# Patient Record
Sex: Female | Born: 1970 | Race: White | Hispanic: No | Marital: Married | Smoking: Current every day smoker
Health system: Southern US, Community
[De-identification: ages and names within clinical notes are randomized; demographics above are authoritative.]

## PROBLEM LIST (undated history)

## (undated) DIAGNOSIS — C539 Malignant neoplasm of cervix uteri, unspecified: Secondary | ICD-10-CM

## (undated) DIAGNOSIS — H3530 Unspecified macular degeneration: Secondary | ICD-10-CM

## (undated) DIAGNOSIS — B019 Varicella without complication: Secondary | ICD-10-CM

## (undated) DIAGNOSIS — G43909 Migraine, unspecified, not intractable, without status migrainosus: Secondary | ICD-10-CM

## (undated) DIAGNOSIS — K219 Gastro-esophageal reflux disease without esophagitis: Secondary | ICD-10-CM

## (undated) HISTORY — DX: Varicella without complication: B01.9

## (undated) HISTORY — PX: CHOLECYSTECTOMY: SHX55

## (undated) HISTORY — DX: Gastro-esophageal reflux disease without esophagitis: K21.9

## (undated) HISTORY — DX: Malignant neoplasm of cervix uteri, unspecified: C53.9

## (undated) HISTORY — DX: Migraine, unspecified, not intractable, without status migrainosus: G43.909

## (undated) HISTORY — PX: TYMPANOSTOMY TUBE PLACEMENT: SHX32

## (undated) HISTORY — PX: ABDOMINAL HYSTERECTOMY: SHX81

## (undated) HISTORY — DX: Unspecified macular degeneration: H35.30

---
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
## 1999-07-08 ENCOUNTER — Emergency Department (HOSPITAL_COMMUNITY): Admission: EM | Admit: 1999-07-08 | Discharge: 1999-07-08 | Payer: Self-pay | Admitting: Emergency Medicine

## 1999-07-08 ENCOUNTER — Encounter: Payer: Self-pay | Admitting: Emergency Medicine

## 1999-10-02 ENCOUNTER — Emergency Department (HOSPITAL_COMMUNITY): Admission: EM | Admit: 1999-10-02 | Discharge: 1999-10-02 | Payer: Self-pay | Admitting: Emergency Medicine

## 2000-01-17 ENCOUNTER — Ambulatory Visit (HOSPITAL_COMMUNITY): Admission: RE | Admit: 2000-01-17 | Discharge: 2000-01-17 | Payer: Self-pay | Admitting: General Surgery

## 2000-09-02 ENCOUNTER — Emergency Department (HOSPITAL_COMMUNITY): Admission: EM | Admit: 2000-09-02 | Discharge: 2000-09-02 | Payer: Self-pay | Admitting: Emergency Medicine

## 2000-09-09 ENCOUNTER — Ambulatory Visit (HOSPITAL_COMMUNITY): Admission: RE | Admit: 2000-09-09 | Discharge: 2000-09-09 | Payer: Self-pay | Admitting: *Deleted

## 2003-06-29 ENCOUNTER — Emergency Department (HOSPITAL_COMMUNITY): Admission: EM | Admit: 2003-06-29 | Discharge: 2003-06-29 | Payer: Self-pay | Admitting: Emergency Medicine

## 2003-10-30 ENCOUNTER — Ambulatory Visit (HOSPITAL_COMMUNITY): Admission: RE | Admit: 2003-10-30 | Discharge: 2003-10-30 | Payer: Self-pay | Admitting: General Surgery

## 2003-10-30 ENCOUNTER — Ambulatory Visit (HOSPITAL_BASED_OUTPATIENT_CLINIC_OR_DEPARTMENT_OTHER): Admission: RE | Admit: 2003-10-30 | Discharge: 2003-10-30 | Payer: Self-pay | Admitting: General Surgery

## 2005-07-19 ENCOUNTER — Emergency Department (HOSPITAL_COMMUNITY): Admission: EM | Admit: 2005-07-19 | Discharge: 2005-07-19 | Payer: Self-pay | Admitting: Emergency Medicine

## 2005-12-05 ENCOUNTER — Emergency Department (HOSPITAL_COMMUNITY): Admission: EM | Admit: 2005-12-05 | Discharge: 2005-12-05 | Payer: Self-pay | Admitting: Emergency Medicine

## 2011-11-25 LAB — HM PAP SMEAR: HM Pap smear: NORMAL

## 2012-11-23 ENCOUNTER — Emergency Department (HOSPITAL_COMMUNITY): Payer: Medicaid - Out of State

## 2012-11-23 ENCOUNTER — Encounter (HOSPITAL_COMMUNITY): Payer: Self-pay | Admitting: *Deleted

## 2012-11-23 ENCOUNTER — Emergency Department (HOSPITAL_COMMUNITY)
Admission: EM | Admit: 2012-11-23 | Discharge: 2012-11-23 | Disposition: A | Discharge status: Home / Self Care | Payer: Medicaid - Out of State | Attending: Emergency Medicine | Admitting: Emergency Medicine

## 2012-11-23 DIAGNOSIS — R0789 Other chest pain: Secondary | ICD-10-CM

## 2012-11-23 DIAGNOSIS — F17200 Nicotine dependence, unspecified, uncomplicated: Secondary | ICD-10-CM | POA: Insufficient documentation

## 2012-11-23 DIAGNOSIS — R071 Chest pain on breathing: Secondary | ICD-10-CM | POA: Insufficient documentation

## 2012-11-23 MED ORDER — IBUPROFEN 800 MG PO TABS: 800.0000 mg | ORAL_TABLET | Freq: Three times a day (TID) | ORAL | Status: DC | PRN

## 2012-11-23 MED ORDER — HYDROCODONE-ACETAMINOPHEN 5-325 MG PO TABS
1.0000 | ORAL_TABLET | Freq: Once | ORAL | Status: AC
  Administered 2012-11-23: 1 via ORAL
  Filled 2012-11-23: qty 1

## 2012-11-23 MED ORDER — IBUPROFEN 800 MG PO TABS
800.0000 mg | ORAL_TABLET | Freq: Once | ORAL | Status: AC
  Administered 2012-11-23: 800 mg via ORAL
  Filled 2012-11-23: qty 1

## 2012-11-23 MED ORDER — HYDROCODONE-ACETAMINOPHEN 5-325 MG PO TABS: 1.0000 | ORAL_TABLET | Freq: Four times a day (QID) | ORAL | Status: DC | PRN
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
## 2012-11-24 NOTE — ED Provider Notes (Signed)
Medical screening examination/treatment/procedure(s) were performed by non-physician practitioner and as supervising physician I was immediately available for consultation/collaboration.   Eliazer Hemphill M Marshaun Lortie, MD 11/24/12 0020 

## 2013-07-19 ENCOUNTER — Emergency Department (HOSPITAL_COMMUNITY)
Admission: EM | Admit: 2013-07-19 | Discharge: 2013-07-19 | Disposition: A | Discharge status: Home / Self Care | Payer: Self-pay | Attending: Emergency Medicine | Admitting: Emergency Medicine

## 2013-07-19 ENCOUNTER — Encounter (HOSPITAL_COMMUNITY): Payer: Self-pay

## 2013-07-19 ENCOUNTER — Emergency Department (HOSPITAL_COMMUNITY): Payer: Self-pay

## 2013-07-19 DIAGNOSIS — F17200 Nicotine dependence, unspecified, uncomplicated: Secondary | ICD-10-CM | POA: Insufficient documentation

## 2013-07-19 DIAGNOSIS — Y929 Unspecified place or not applicable: Secondary | ICD-10-CM | POA: Insufficient documentation

## 2013-07-19 DIAGNOSIS — S90212A Contusion of left great toe with damage to nail, initial encounter: Secondary | ICD-10-CM

## 2013-07-19 DIAGNOSIS — W208XXA Other cause of strike by thrown, projected or falling object, initial encounter: Secondary | ICD-10-CM | POA: Insufficient documentation

## 2013-07-19 DIAGNOSIS — Z87828 Personal history of other (healed) physical injury and trauma: Secondary | ICD-10-CM | POA: Insufficient documentation

## 2013-07-19 DIAGNOSIS — Y9389 Activity, other specified: Secondary | ICD-10-CM | POA: Insufficient documentation

## 2013-07-19 DIAGNOSIS — S90129A Contusion of unspecified lesser toe(s) without damage to nail, initial encounter: Secondary | ICD-10-CM | POA: Insufficient documentation

## 2013-07-19 MED ORDER — HYDROCODONE-ACETAMINOPHEN 5-325 MG PO TABS
2.0000 | ORAL_TABLET | Freq: Once | ORAL | Status: AC
  Administered 2013-07-19: 2 via ORAL
  Filled 2013-07-19: qty 2

## 2013-07-19 MED ORDER — HYDROCODONE-ACETAMINOPHEN 5-325 MG PO TABS: ORAL_TABLET | ORAL | Status: DC

## 2013-07-19 NOTE — ED Provider Notes (Signed)
CSN: [id number]     Arrival date & time 07/19/13  1819 History  This chart was scribed for non-physician practitioner Junius Finner, PA-C working with Flint Melter, MD by Valera Castle, ED scribe. This patient was seen in room WTR6/WTR6 and the patient's care was started at 6:25 PM.    Chief Complaint  Patient presents with  . Toe Injury    left side    The history is provided by the patient. No language interpreter was used.   HPI Comments: Mary Miles is a 42 y.o. female who presents to the Emergency Department complaining of sudden onset throbbing left great toe pain due to an injury earlier today when she dropped a heavy crate on her foot. She also fell last week walking down stairs and her toenail started turning black and blue. Pt states bearing weight worsens the pain. Pt denies fever. Pt denies falling on her head and LOC. Pt has been taking Tylenol for the pain. Pt states she is experiencing a little ankle pain. She has no history of gout.    History reviewed. No pertinent past medical history. Past Surgical History  Procedure Laterality Date  . Cholecystectomy    . Abdominal hysterectomy     No family history on file. History  Substance Use Topics  . Smoking status: Current Every Day Smoker -- 1.00 packs/day for 4 years    Types: Cigarettes  . Smokeless tobacco: Not on file  . Alcohol Use: No   OB History   Grav Para Term Preterm Abortions TAB SAB Ect Mult Living                 Review of Systems  Constitutional: Negative for fever.  Musculoskeletal: Positive for joint swelling and arthralgias.  All other systems reviewed and are negative.    Allergies  Review of patient's allergies indicates no known allergies.  Home Medications   Current Outpatient Rx  Name  Route  Sig  Dispense  Refill  . acetaminophen (TYLENOL) 500 MG tablet   Oral   Take 500 mg by mouth every 6 (six) hours as needed for pain.         Marland Kitchen HYDROcodone-acetaminophen  (NORCO/VICODIN) 5-325 MG per tablet      Take 1-2 pills every 4-6 hours as needed for pain.   10 tablet   0    BP 112/75  Pulse 85  Temp(Src) 98.6 F (37 C) (Oral)  Resp 18  Ht 5\' 6"  (1.676 m)  Wt 150 lb (68.04 kg)  BMI 24.22 kg/m2  SpO2 100%  Physical Exam  Nursing note and vitals reviewed. Constitutional: She is oriented to person, place, and time. She appears well-developed and well-nourished.  HENT:  Head: Normocephalic and atraumatic.  Eyes: EOM are normal.  Neck: Normal range of motion. Neck supple.  Cardiovascular: Normal rate, regular rhythm and normal heart sounds.   Pulmonary/Chest: Effort normal and breath sounds normal.  Abdominal: Soft. There is no tenderness.  Musculoskeletal: Normal range of motion.  Tenderness to palpation of left great toe. Dorsal aspect hematoma under the nail bed. 2+ pedal pulse. Full ROM. NO warmth or erythema.   Neurological: She is alert and oriented to person, place, and time.  Skin: Skin is warm and dry.  Psychiatric: She has a normal mood and affect. Her behavior is normal.    ED Course  NERVE BLOCK Date/Time: 07/19/2013 8:29 PM Performed by: Junius Finner Authorized by: Junius Finner Consent: Verbal consent obtained. Risks and  benefits: risks, benefits and alternatives were discussed Consent given by: patient Patient understanding: patient states understanding of the procedure being performed Patient consent: the patient's understanding of the procedure matches consent given Patient identity confirmed: verbally with patient Indications: pain relief Body area: lower extremity Nerve: digital Laterality: left Patient sedated: no Preparation: Patient was prepped and draped in the usual sterile fashion. Patient position: supine Needle gauge: 25 G Location technique: anatomical landmarks Local anesthetic: lidocaine 1% without epinephrine and bupivacaine 0.5% without epinephrine Anesthetic total: 4 ml Outcome: pain  improved Patient tolerance: Patient tolerated the procedure well with no immediate complications. Comments: Procedure was performed on left great toe for pressure relief from subungal hematoma.  Nail was raise off underlying skin but not removed.     DIAGNOSTIC STUDIES: Oxygen Saturation is 100% on room air, normal by my interpretation.    COORDINATION OF CARE: 7:29 PM-Discussed treatment plan which includes consulting with the physician with pt at bedside and pt agreed to plan.   7:36 PM Will I&D left great toenail.  INCISION AND DRAINAGE Performed by: Junius Finner PA-C Consent: Verbal consent obtained. Risks and benefits: risks, benefits and alternatives were discussed Type: hematoma  Body area: left great toe  Anesthesia: local infiltration  Incision was made with a 18 gauge needle.  Local anesthetic: none  Anesthetic total: 0 ml  Complexity: complex Blunt dissection to break up loculations  Drainage: serosanguineous  Drainage amount: none  Packing material: none  Patient tolerance: Patient tolerated the procedure well with no immediate complications.     Labs Review Labs Reviewed - No data to display Imaging Review Dg Foot Complete Left  07/19/2013   *RADIOLOGY REPORT*  Clinical Data: 13 injury.  LEFT FOOT - COMPLETE 3+ VIEW  Comparison: None.  Findings: Bones, joint spaces and soft tissues demonstrate no evidence of fracture or dislocation.  There is minimal degenerative change of the first MTP joint.  IMPRESSION: No acute findings.   Original Report Authenticated By: Elberta Fortis, M.D.    MDM   1. Hematoma, subungual, great toe, left, initial encounter    Pt has subungal hematoma of left great toe. C/o severe pain and pressure. Attempted pressure relief with 18gtt needle.  Pt still c/o pain.  Discussed with Dr. Effie Shy who examined pt.  Digital block was performed and nail raised but not removed.  Bupivicaine used for longer relief.  Advised pt nail may  fall off but does not appear infected at this time.  Encouraged to keep elevated when possible.  Rx: norco. F/u with [facility]. Return precautions provided. Pt verbalized understanding and agreement with tx plan.   I personally performed the services described in this documentation, which was scribed in my presence. The recorded information has been reviewed and is accurate.   Junius Finner, PA-C 07/19/13 2032

## 2013-07-19 NOTE — ED Notes (Signed)
PA O'Malley at bedside. 

## 2013-07-19 NOTE — ED Notes (Signed)
Pt reports fell last week injured left toe and re injured left toe today. Dropped 8 2 Liter crated onto left foot today. Good CMS throbbing pain to foot

## 2013-07-19 NOTE — ED Notes (Signed)
Pt has a ride home.  

## 2013-07-19 NOTE — ED Notes (Signed)
L great toe dressed with Telfa gauze and coban wrap. Pt instructed on further wound care.

## 2013-07-20 NOTE — ED Provider Notes (Signed)
Medical screening examination/treatment/procedure(s) were performed by non-physician practitioner and as supervising physician I was immediately available for consultation/collaboration.  Flint Melter, MD 07/20/13 [phone]

## 2013-09-16 ENCOUNTER — Emergency Department (HOSPITAL_COMMUNITY)
Admission: EM | Admit: 2013-09-16 | Discharge: 2013-09-16 | Disposition: A | Discharge status: Home / Self Care | Payer: No Typology Code available for payment source | Attending: Emergency Medicine | Admitting: Emergency Medicine

## 2013-09-16 ENCOUNTER — Encounter (HOSPITAL_COMMUNITY): Payer: Self-pay | Admitting: Emergency Medicine

## 2013-09-16 DIAGNOSIS — R21 Rash and other nonspecific skin eruption: Secondary | ICD-10-CM

## 2013-09-16 DIAGNOSIS — F17200 Nicotine dependence, unspecified, uncomplicated: Secondary | ICD-10-CM | POA: Insufficient documentation

## 2013-09-16 DIAGNOSIS — N949 Unspecified condition associated with female genital organs and menstrual cycle: Secondary | ICD-10-CM | POA: Insufficient documentation

## 2013-09-16 DIAGNOSIS — N898 Other specified noninflammatory disorders of vagina: Secondary | ICD-10-CM

## 2013-09-16 LAB — URINALYSIS, ROUTINE W REFLEX MICROSCOPIC
Bilirubin Urine: NEGATIVE
Glucose, UA: NEGATIVE mg/dL
Ketones, ur: NEGATIVE mg/dL
Specific Gravity, Urine: 1.029 (ref 1.005–1.030)
pH: 6.5 (ref 5.0–8.0)

## 2013-09-16 LAB — WET PREP, GENITAL: Yeast Wet Prep HPF POC: NONE SEEN

## 2013-09-16 MED ORDER — VALACYCLOVIR HCL 1 G PO TABS: 1000.0000 mg | ORAL_TABLET | Freq: Two times a day (BID) | ORAL | Status: DC

## 2013-09-16 MED ORDER — DEXAMETHASONE SODIUM PHOSPHATE 10 MG/ML IJ SOLN
10.0000 mg | Freq: Once | INTRAMUSCULAR | Status: AC
  Administered 2013-09-16: 10 mg via INTRAVENOUS
  Filled 2013-09-16: qty 1

## 2013-09-16 MED ORDER — LIDOCAINE HCL 2 % EX GEL: CUTANEOUS | Status: DC | PRN

## 2013-09-16 MED ORDER — PERMETHRIN 5 % EX CREA: TOPICAL_CREAM | CUTANEOUS | Status: DC

## 2013-09-16 MED ORDER — DIPHENHYDRAMINE HCL 50 MG/ML IJ SOLN
25.0000 mg | Freq: Once | INTRAMUSCULAR | Status: AC
  Administered 2013-09-16: 25 mg via INTRAVENOUS
  Filled 2013-09-16: qty 1

## 2013-09-16 MED ORDER — FAMOTIDINE IN NACL 20-0.9 MG/50ML-% IV SOLN
20.0000 mg | Freq: Once | INTRAVENOUS | Status: AC
  Administered 2013-09-16: 20 mg via INTRAVENOUS
  Filled 2013-09-16: qty 50

## 2013-09-16 NOTE — ED Notes (Signed)
Pt states she broke out in rash on Monday and then yesterday (Thursday)  Rash moved to vaginal area and it is painful to walk , sit or stand and it is now blisters and bleeding.

## 2013-09-16 NOTE — ED Provider Notes (Signed)
CSN: [id number]     Arrival date & time 09/16/13  2016 History   First MD Initiated Contact with Patient 09/16/13 2030     Chief Complaint  Patient presents with  . Vaginal Pain  . Rash   (Consider location/radiation/quality/duration/timing/severity/associated sxs/prior Treatment) The history is provided by the patient and medical records.   This is a 42 year old female with no significant past medical history presenting to the ED for diffuse bodily rash, onset 4 days ago.  Patient states rash is extremely pruritic and some places are painful where she has scratched her skin raw.  No recent tick exposure.  No fevers, sweats, chills, or body aches.  Patient states she began having vaginal pain and irritation on Wednesday evening. States it is difficult for her to wear clothes, sit down, or walk around due to friction. Earlier tonight she became so uncomfortable she had her boyfriend look at her vaginal area, he states there was a lesion that looked like an ulcer. Patient has no prior history of STDs.  No vaginal discharge or urinary sx.  No recent changes in soaps or detergents.  No recent travel but did move to a new home and bought a new mattress.  Husband has some isolated lesions on his abdomen that are also pruritic.  Has been using topical benadryl cream without relief.  History reviewed. No pertinent past medical history. Past Surgical History  Procedure Laterality Date  . Cholecystectomy    . Abdominal hysterectomy     No family history on file. History  Substance Use Topics  . Smoking status: Current Every Day Smoker -- 1.00 packs/day for 4 years    Types: Cigarettes  . Smokeless tobacco: Not on file  . Alcohol Use: No   OB History   Grav Para Term Preterm Abortions TAB SAB Ect Mult Living                 Review of Systems  Genitourinary: Positive for vaginal pain.  Skin: Positive for rash.    Allergies  Review of patient's allergies indicates no known  allergies.  Home Medications   Current Outpatient Rx  Name  Route  Sig  Dispense  Refill  . acetaminophen (TYLENOL) 500 MG tablet   Oral   Take 500 mg by mouth every 6 (six) hours as needed for pain.         . Aspirin-Acetaminophen-Caffeine (GOODYS EXTRA STRENGTH) [phone] MG PACK   Oral   Take 1 packet by mouth every 8 (eight) hours as needed (pain).          BP 118/80  Pulse 90  Temp(Src) 98.5 F (36.9 C) (Oral)  Resp 16  SpO2 100%  Physical Exam  Nursing note and vitals reviewed. Constitutional: She is oriented to person, place, and time. She appears well-developed and well-nourished.  HENT:  Head: Normocephalic and atraumatic.  Mouth/Throat: Oropharynx is clear and moist.  Eyes: Conjunctivae and EOM are normal. Pupils are equal, round, and reactive to light.  Neck: Normal range of motion.  Cardiovascular: Normal rate, regular rhythm and normal heart sounds.   Pulmonary/Chest: Effort normal and breath sounds normal.  Abdominal: Soft. Bowel sounds are normal. There is no tenderness. There is no guarding.  Genitourinary:    No bleeding around the vagina. No vaginal discharge found.  Musculoskeletal: Normal range of motion. She exhibits no edema.  Neurological: She is alert and oriented to person, place, and time.  Skin: Skin is warm and dry. Rash noted. No  petechiae noted. Rash is not vesicular.  Diffuse, bite appearing lesions scattered across BUE and trunk; extremely pruritic; no lesions on palms or soles  Psychiatric: She has a normal mood and affect.    ED Course  Procedures (including critical care time) Labs Review Labs Reviewed  WET PREP, GENITAL - Abnormal; Notable for the following:    Clue Cells Wet Prep HPF POC RARE (*)    WBC, Wet Prep HPF POC RARE (*)    All other components within normal limits  URINALYSIS, ROUTINE W REFLEX MICROSCOPIC - Abnormal; Notable for the following:    APPearance CLOUDY (*)    Leukocytes, UA SMALL (*)    All other  components within normal limits  URINE MICROSCOPIC-ADD ON - Abnormal; Notable for the following:    Squamous Epithelial / LPF FEW (*)    Bacteria, UA FEW (*)    All other components within normal limits  GC/CHLAMYDIA PROBE AMP  RPR   Imaging Review No results found.  EKG Interpretation   None       MDM   1. Vaginal lesion   2. Rash and other nonspecific skin eruption    Scattered, pruritic, bite appearing lesions scattered across BUE and trunk. No petechia, no vesicles.  Sx controlled with pepcid, decadron, and benadryl.  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  RPR, Gc/Chl pending.  U/a as above.  Wet prep with rare clue cells, however i do not feel this indicates infection.  Will initiate treatment with valtrex, permethrin, and topical lidocaine.  Instructed that she will be notified in 48-72 hours if culture results are abnormal or require treatment.  Discussed plan with pt, he agreed.  Return precautions advised.  Garlon Hatchet, PA-C 09/16/13 2356

## 2013-09-16 NOTE — ED Notes (Signed)
Pt states that she has broke out in a whole body rash that began Monday; pt states that the rash is itchy and red; pt states that she began to have vaginal pain Wed night; pt denies drainage but states that the vaginal area is swollen, red and irritated; pt states that it is uncomfortable to wear pants to walk or sit due to irritation.

## 2013-09-16 NOTE — ED Notes (Signed)
Pt. Made aware for the need of urine. 

## 2013-09-16 NOTE — ED Notes (Signed)
Pt ambulated to bathroom to obtain urine sample.

## 2013-09-17 LAB — URINE MICROSCOPIC-ADD ON

## 2013-09-17 LAB — GC/CHLAMYDIA PROBE AMP: GC Probe RNA: NEGATIVE

## 2013-09-18 NOTE — ED Provider Notes (Signed)
Medical screening examination/treatment/procedure(s) were performed by non-physician practitioner and as supervising physician I was immediately available for consultation/collaboration.  EKG Interpretation   None         Kristen N Ward, DO 09/18/13 0727 

## 2013-11-24 ENCOUNTER — Emergency Department (HOSPITAL_COMMUNITY)
Admission: EM | Admit: 2013-11-24 | Discharge: 2013-11-24 | Disposition: A | Discharge status: Home / Self Care | Payer: No Typology Code available for payment source | Attending: Emergency Medicine | Admitting: Emergency Medicine

## 2013-11-24 ENCOUNTER — Encounter (HOSPITAL_COMMUNITY): Payer: Self-pay | Admitting: Emergency Medicine

## 2013-11-24 DIAGNOSIS — H9209 Otalgia, unspecified ear: Secondary | ICD-10-CM | POA: Insufficient documentation

## 2013-11-24 DIAGNOSIS — F17200 Nicotine dependence, unspecified, uncomplicated: Secondary | ICD-10-CM | POA: Insufficient documentation

## 2013-11-24 DIAGNOSIS — H60399 Other infective otitis externa, unspecified ear: Secondary | ICD-10-CM | POA: Insufficient documentation

## 2013-11-24 DIAGNOSIS — R05 Cough: Secondary | ICD-10-CM | POA: Insufficient documentation

## 2013-11-24 DIAGNOSIS — R110 Nausea: Secondary | ICD-10-CM | POA: Insufficient documentation

## 2013-11-24 DIAGNOSIS — R059 Cough, unspecified: Secondary | ICD-10-CM | POA: Insufficient documentation

## 2013-11-24 DIAGNOSIS — H6092 Unspecified otitis externa, left ear: Secondary | ICD-10-CM

## 2013-11-24 DIAGNOSIS — R42 Dizziness and giddiness: Secondary | ICD-10-CM | POA: Insufficient documentation

## 2013-11-24 MED ORDER — ANTIPYRINE-BENZOCAINE 5.4-1.4 % OT SOLN: 3.0000 [drp] | OTIC | Status: DC | PRN

## 2013-11-24 MED ORDER — OXYCODONE-ACETAMINOPHEN 5-325 MG PO TABS
1.0000 | ORAL_TABLET | Freq: Once | ORAL | Status: AC
  Administered 2013-11-24: 1 via ORAL
  Filled 2013-11-24: qty 1

## 2013-11-24 MED ORDER — HYDROCODONE-ACETAMINOPHEN 5-325 MG PO TABS: 1.0000 | ORAL_TABLET | ORAL | Status: DC | PRN

## 2013-11-24 MED ORDER — NEOMYCIN-POLYMYXIN-HC 3.5-10000-1 OT SUSP: 4.0000 [drp] | Freq: Four times a day (QID) | OTIC | Status: DC

## 2013-11-24 NOTE — ED Notes (Signed)
Pt states she began having left ear pain 2 days ago.  Pt endorses nausea, but denies fever and vomiting.  Pt's left tympanic membrane is red.  She states it feels as though she has fluid in her inner ear. Pt states several years ago she tubes placed in her ears due to fluid in her inner ear.

## 2013-11-24 NOTE — ED Notes (Signed)
Pt states that she had plugs put in ear 2 yrs ago and had them come out and is now having pain to lt ear and feels like fluid is in ear.

## 2013-11-24 NOTE — Discharge Instructions (Signed)
Read the information below.  Use the prescribed medication as directed.  Please discuss all new medications with your pharmacist.  Do not take additional tylenol while taking the prescribed pain medication to avoid overdose.  You may return to the Emergency Department at any time for worsening condition or any new symptoms that concern you.  If you develop fevers, uncontrolled pain, uncontrolled vomiting, bleeding from your ear, or difficulty breathing or swallowing return to the ER for a recheck.      Otitis Externa Otitis externa is a bacterial or fungal infection of the outer ear canal. This is the area from the eardrum to the outside of the ear. Otitis externa is sometimes called "swimmer's ear." CAUSES  Possible causes of infection include:  Swimming in dirty water.  Moisture remaining in the ear after swimming or bathing.  Mild injury (trauma) to the ear.  Objects stuck in the ear (foreign body).  Cuts or scrapes (abrasions) on the outside of the ear. SYMPTOMS  The first symptom of infection is often itching in the ear canal. Later signs and symptoms may include swelling and redness of the ear canal, ear pain, and yellowish-white fluid (pus) coming from the ear. The ear pain may be worse when pulling on the earlobe. DIAGNOSIS  Your caregiver will perform a physical exam. A sample of fluid may be taken from the ear and examined for bacteria or fungi. TREATMENT  Antibiotic ear drops are often given for 10 to 14 days. Treatment may also include pain medicine or corticosteroids to reduce itching and swelling. PREVENTION   Keep your ear dry. Use the corner of a towel to absorb water out of the ear canal after swimming or bathing.  Avoid scratching or putting objects inside your ear. This can damage the ear canal or remove the protective wax that lines the canal. This makes it easier for bacteria and fungi to grow.  Avoid swimming in lakes, polluted water, or poorly chlorinated  pools.  You may use ear drops made of rubbing alcohol and vinegar after swimming. Combine equal parts of white vinegar and alcohol in a bottle. Put 3 or 4 drops into each ear after swimming. HOME CARE INSTRUCTIONS   Apply antibiotic ear drops to the ear canal as prescribed by your caregiver.  Only take over-the-counter or prescription medicines for pain, discomfort, or fever as directed by your caregiver.  If you have diabetes, follow any additional treatment instructions from your caregiver.  Keep all follow-up appointments as directed by your caregiver. SEEK MEDICAL CARE IF:   You have a fever.  Your ear is still red, swollen, painful, or draining pus after 3 days.  Your redness, swelling, or pain gets worse.  You have a severe headache.  You have redness, swelling, pain, or tenderness in the area behind your ear. MAKE SURE YOU:   Understand these instructions.  Will watch your condition.  Will get help right away if you are not doing well or get worse. Document Released: 11/10/2005 Document Revised: 02/02/2012 Document Reviewed: 11/27/2011 [facility] Patient Information 2014 [place].   Emergency Department Resource Guide 1) Find a Doctor and Pay Out of Pocket Although you won't have to find out who is covered by your insurance plan, it is a good idea to ask around and get recommendations. You will then need to call the office and see if the doctor you have chosen will accept you as a new patient and what types of options they offer for patients who  are self-pay. Some doctors offer discounts or will set up payment plans for their patients who do not have insurance, but you will need to ask so you aren't surprised when you get to your appointment.  2) Contact Your Local Health Department Not all health departments have doctors that can see patients for sick visits, but many do, so it is worth a call to see if yours does. If you don't know where your local health  department is, you can check in your phone book. The CDC also has a tool to help you locate your state's health department, and many state websites also have listings of all of their local health departments.  3) Find a [facility] If your illness is not likely to be very severe or complicated, you may want to try a walk in clinic. These are popping up all over the country in pharmacies, drugstores, and shopping centers. They're usually staffed by nurse practitioners or physician assistants that have been trained to treat common illnesses and complaints. They're usually fairly quick and inexpensive. However, if you have serious medical issues or chronic medical problems, these are probably not your best option.  No Primary Care Doctor: - Call Health Connect at  [phone] - they can help you locate a primary care doctor that  accepts your insurance, provides certain services, etc. - Physician Referral Service- [phone]  Chronic Pain Problems: Organization         Address  Phone   Notes  [facility]  [phone] Patients need to be referred by their primary care doctor.   Medication Assistance: Organization         Address  Phone   Notes  [facility] Medication [facility] [address] [place] [address] [place], [place] [address] [phone] --Must be a resident of [facility] -- Must have NO insurance coverage whatsoever (no Medicaid/ Medicare, etc.) -- The pt. MUST have a primary care doctor that directs their care regularly and follows them in the community   MedAssist  [phone]   [employer]  [phone]    Agencies that provide inexpensive medical care: Organization         Address  Phone   Notes  [place]  [phone]   [facility]    [phone]   [facility] [place], [place] [address] [phone]   [place] [address], [place] [phone]   Planned Parenthood    [phone]   [facility]    [phone]   [place] and [place] [address], [place] Phone:  [phone], Fax:  [phone] Hours of Operation:  9 am - 6 pm, M-F.  Also accepts Medicaid/Medicare and self-pay.  [facility] for [place], [address],  Phone: [phone], Fax: [phone]. Hours of Operation:  8:30 am - 5:30 pm, M-F.  Also accepts Medicaid and self-pay.  [facility] [place] [address], [place] Phone: [phone]   [place], [place], [place] [phone], Ext. [phone] Mondays & Thursdays: 7-9 AM.  First 15 patients are seen on a first come, first serve basis.    [place] Providers:  Organization         Address  Phone   Notes  [facility] [address], [place] [phone] Also accepts self-pay patients.  [facility] [id number] [place], [place]  [phone]   [place], [address], [place] [phone]   [facility] [address] [place] [phone]   Lucianne Lei [address], [place]   [phone] Only accepts [place] Access [place] patients after they have their name applied to their card.   Self-Pay (no insurance) in [facility]:  Organization         Address  Phone   Notes  Sickle Cell Patients, [facility] [place] [phone]   [facility] [place] [phone]   [facility] [place]  [place], [place], [place] [phone]   [facility]/Dr. Osei-Bonsu  [address], [place] or [address], [place] [phone] Phone number for both [place] and  [place] locations is the same.  Urgent Medical and [facility] [address] [place] [phone]   [facility] [address], [place] or [address] [phone]   [facility] [address] [place] [phone], phone; [phone], fax Sees patients 1st and 3rd Saturday of every month.  Must not qualify for public or private insurance (i.e. Medicaid, Medicare, [place] Health Choice, Veterans' Benefits)  Household income should be no more than 200% of the poverty level The clinic cannot treat you if you are pregnant or think you are pregnant  Sexually transmitted diseases are not treated at the clinic.    Dental Care: Organization         Address  Phone  Notes  [facility] Department of [facility] [place] [phone] Accepts children up to age 26 who are enrolled in [place] or [place]; pregnant women with a Medicaid card; and children who have applied for Medicaid or [place] Health Choice, but were declined, whose parents can pay a reduced fee at time of service.  [facility] Department of [facility]  [address], [place] [phone] Accepts children up to age 31 who are enrolled in [place] or [place]; pregnant women with a Medicaid card; and children who have applied for Medicaid or [place] Health Choice, but were declined, whose parents can pay a reduced fee at time of service.  [place] Adult Dental Access PROGRAM  [place] [phone] Patients are seen by appointment only. Walk-ins are not accepted. [place] will see patients 18 years of age and older. Monday - Tuesday (8am-5pm) Most Wednesdays (8:30-5pm) $30 per visit, cash only  [facility] Adult Dental Access PROGRAM  [address], [facility] [place] [phone] Patients are seen by appointment only. Walk-ins are not accepted.  [place] will see patients 38 years of age and older. One Wednesday Evening (Monthly: Volunteer Based).  $30 per visit, cash only  [place]  [phone] for adults; Children under age 57, call Graduate Pediatric Dentistry at [phone]. Children aged 58-14, please call [phone] to request a pediatric application.  Dental services are provided in all areas of dental care including fillings, crowns and bridges, complete and partial dentures, implants, gum treatment, root canals,  and extractions. Preventive care is also provided. Treatment is provided to both adults and children. Patients are selected via a lottery and there is often a waiting list.   [facility] [place] [address] [place]  [phone] [email or web address]   [facility] [address] [place], [place] [phone], Ext. [phone] Second and Fourth Thursday of each month, opens at 6:30 AM; Clinic ends at 9 AM.  Patients are seen on a first-come first-served basis, and a limited number are seen during each clinic.   [facility]  [address] Hillard Danker [place], [place] [phone]   Eligibility Requirements You must have lived in [place], [place], or [place] counties for at least the last three months.   You cannot be eligible for state or federal sponsored [employer], including [employer], [place], or [employer].   You generally cannot be eligible for healthcare insurance through your employer.    How to apply: Eligibility screenings are held every Tuesday and Wednesday afternoon from 1:00 pm until 4:00 pm. You do not need an appointment for the interview!  [facility] [address], [place], [place]   [place]  Cash Department  [place]  [phone]    Behavioral Health Resources in the  Community: Intensive Outpatient Programs Organization         Address  Phone  Notes  [place]. [address], [place], [place] [phone]   [facility] Outpatient [address] [place], [place]   ADS: Alcohol [facility] Drug S[facility] [address] [place], [place]   [place] [address]  [place], [place] or [phone]   Substance Abuse Resources Organization         Address  Phone  Notes  Alcohol and Drug Services  [phone]   [place]  [phone]   [facility]   Chinita Pester  [phone]   Residential & Outpatient Substance Abuse Program  [phone]   Psychological Services Organization         Address  Phone  Notes  [facility] [place]  [place]  [phone]   [place] [address] [place] [address], [place] or [phone]    Mobile Crisis Teams Organization         Address  Phone  Notes  [facility], Mobile Crisis Care Unit  [phone]   [facility]  [address] [place], [place]   [place] Levels [address], [place] [phone]    Self-Help/Support Groups Organization         Address  Phone             Notes  [place]. of [place] - variety of support groups  [place] Call for more information  Narcotics Anonymous (NA), [facility] [address], [employer] [place]  2 meetings at this location   Special educational needs teacher         Address  Phone  Notes  [facility] [place],    [place]  [place]  [address] [place] [phone], [place], [place]   [place], [place] [phone] Admissions: 8am-3pm M-F  Incentives Substance [place] [address],    [place], [place] [phone]   [facility] [address] [place], [place], [place]   [facility] [address]  [place], [place]   [facility]ight Programs - Intensive Outpatient [address], [place], [place]   [facility] ([place] [place].,  [place], [place] [phone] or [phone]   [facility]idential Treatment Services (RTS) [address] [place], [place] Accepts Medicaid  [facility] [place] [address],  [place] [phone] Substance Abuse/Addiction Treatment   [facility] Organization         Address  Phone  Notes  [facility]  [phone]   Domenic Schwab, PhD [address] Arlis Porta [place], [place]   [phone] or [phone]   Arnold Line [place], [place] [phone]   [facility] [address], [place], [place] [phone] Insurance/Medicaid/sponsorship through [facility] [address] [place]                                    [place], [place] [phone] [place] [address], [place] [phone]    Dr. Adele Schilder  [phone]   [facility] Dept. 1) [address]  2) [place] Level 3)  [place] [address], [place] [phone]  [phone]   [place] [phone] or [phone] (After Hours)

## 2013-11-24 NOTE — ED Provider Notes (Signed)
CSN: [id number]     Arrival date & time 11/24/13  1341 History  This chart was scribed for non-physician practitioner Clayton Bibles, PA-C working with Nat Christen, MD by Anastasia Pall, ED scribe. This patient was seen in room WTR8/WTR8 and the patient's care was started at 3:13 PM.   Chief Complaint  Patient presents with  . Otalgia    The history is provided by the patient. No language interpreter was used.   HPI Comments: Mary Miles is a 43 y.o. female who presents to the Emergency Department complaining of sudden, moderate, constant, left ear pain, that radiates down her neck, onset 2 days ago after waking up. She reports yesterday experiencing associated dizziness, stating her left ear began to throb. Also feels like there is fluid in her ear and her hearing is muffled. She reports associated sore throat, and mild, intermittent, dry cough. She denies having tried to put anything in hear ear. She reports 2 years ago having tubes placed in bilateral ears. She states tubes got stuck in her ear and they had to take tubes back out. She denies fever, chills, body aches, and any other associated symptoms.She denies having ear doctor.   PCP - No primary provider on file.  History reviewed. No pertinent past medical history. Past Surgical History  Procedure Laterality Date  . Cholecystectomy    . Abdominal hysterectomy     No family history on file. History  Substance Use Topics  . Smoking status: Current Every Day Smoker -- 1.00 packs/day for 4 years    Types: Cigarettes  . Smokeless tobacco: Not on file  . Alcohol Use: No   OB History   Grav Para Term Preterm Abortions TAB SAB Ect Mult Living                 Review of Systems  Constitutional: Negative for fever and chills.  HENT: Positive for ear pain (left).   Respiratory: Positive for cough (dry).   Gastrointestinal: Positive for nausea. Negative for vomiting.  Musculoskeletal: Negative for myalgias.  Neurological: Positive for  dizziness.    Allergies  Review of patient's allergies indicates no known allergies.  Home Medications   Current Outpatient Rx  Name  Route  Sig  Dispense  Refill  . Aspirin-Acetaminophen-Caffeine (GOODYS EXTRA STRENGTH) 500-325-65 MG PACK   Oral   Take 1 packet by mouth every 8 (eight) hours as needed (pain).          BP 106/73  Pulse 89  Temp(Src) 98.6 F (37 C) (Oral)  Resp 14  SpO2 100%  Physical Exam  Nursing note and vitals reviewed. Constitutional: She appears well-developed and well-nourished. No distress.  HENT:  Head: Normocephalic and atraumatic.  Mouth/Throat: Uvula is midline, oropharynx is clear and moist and mucous membranes are normal.  Left ear canal erythematous and edematous. Tenderness to palpation of tragus and traction on canal.  Neck: Neck supple.  Pulmonary/Chest: Effort normal.  Neurological: She is alert.  Skin: She is not diaphoretic.    ED Course  Procedures (including critical care time)  DIAGNOSTIC STUDIES: Oxygen Saturation is 100% on room air, normal by my interpretation.    COORDINATION OF CARE: 3:16 PM-Discussed treatment plan which includes Auralgan, Hydrocodone, and Cortisporin with pt at bedside and pt agreed to plan.   Labs Review Labs Reviewed - No data to display Imaging Review No results found.  EKG Interpretation   None       MDM   1. Left otitis  externa    Afebrile, nontoxic patient with left otitis externa it is acute. Tympanic membrane is normal. Patient discharged home with Auralgan and, Norco, Cortisporin otic drops.  Discussed findings, treatment, and follow up  with patient.  Pt given return precautions.  Pt verbalizes understanding and agrees with plan.        I personally performed the services described in this documentation, which was scribed in my presence. The recorded information has been reviewed and is accurate.    [place], PA-C 11/24/13 1621

## 2013-11-25 NOTE — ED Provider Notes (Signed)
Medical screening examination/treatment/procedure(s) were performed by non-physician practitioner and as supervising physician I was immediately available for consultation/collaboration.  EKG Interpretation   None        Nat Christen, MD 11/25/13 [phone]

## 2014-01-14 ENCOUNTER — Emergency Department (HOSPITAL_COMMUNITY)
Admission: EM | Admit: 2014-01-14 | Discharge: 2014-01-14 | Disposition: A | Discharge status: Home / Self Care | Payer: No Typology Code available for payment source | Attending: Emergency Medicine | Admitting: Emergency Medicine

## 2014-01-14 ENCOUNTER — Encounter (HOSPITAL_COMMUNITY): Payer: Self-pay | Admitting: Emergency Medicine

## 2014-01-14 ENCOUNTER — Emergency Department (HOSPITAL_COMMUNITY): Payer: No Typology Code available for payment source

## 2014-01-14 DIAGNOSIS — B9789 Other viral agents as the cause of diseases classified elsewhere: Secondary | ICD-10-CM

## 2014-01-14 DIAGNOSIS — F17200 Nicotine dependence, unspecified, uncomplicated: Secondary | ICD-10-CM | POA: Insufficient documentation

## 2014-01-14 DIAGNOSIS — R1110 Vomiting, unspecified: Secondary | ICD-10-CM | POA: Insufficient documentation

## 2014-01-14 DIAGNOSIS — H9209 Otalgia, unspecified ear: Secondary | ICD-10-CM | POA: Insufficient documentation

## 2014-01-14 DIAGNOSIS — R197 Diarrhea, unspecified: Secondary | ICD-10-CM | POA: Insufficient documentation

## 2014-01-14 DIAGNOSIS — J069 Acute upper respiratory infection, unspecified: Secondary | ICD-10-CM

## 2014-01-14 MED ORDER — ACETAMINOPHEN-CODEINE 120-12 MG/5ML PO SOLN: 10.0000 mL | ORAL | Status: DC | PRN

## 2014-01-14 MED ORDER — ACETAMINOPHEN-CODEINE #3 300-30 MG PO TABS
1.0000 | ORAL_TABLET | Freq: Once | ORAL | Status: AC
  Administered 2014-01-14: 1 via ORAL
  Filled 2014-01-14: qty 1

## 2014-01-14 MED ORDER — ALBUTEROL SULFATE HFA 108 (90 BASE) MCG/ACT IN AERS
2.0000 | INHALATION_SPRAY | RESPIRATORY_TRACT | Status: DC | PRN
  Administered 2014-01-14: 2 via RESPIRATORY_TRACT
  Filled 2014-01-14: qty 6.7

## 2014-01-14 MED ORDER — PREDNISONE 50 MG PO TABS
50.0000 mg | ORAL_TABLET | Freq: Every day | ORAL | Status: DC
Start: 2014-01-14 — End: 2014-01-24

## 2014-01-14 MED ORDER — GUAIFENESIN ER 1200 MG PO TB12: 1.0000 | ORAL_TABLET | Freq: Two times a day (BID) | ORAL | Status: DC

## 2014-01-14 NOTE — Discharge Instructions (Signed)
Return here as needed.  Followup with your Dr. for recheck.  Increase your fluid intake, rest as much possible

## 2014-01-14 NOTE — ED Provider Notes (Signed)
Medical screening examination/treatment/procedure(s) were performed by non-physician practitioner and as supervising physician I was immediately available for consultation/collaboration.  EKG Interpretation   None       Rolland Porter, MD, Abram Sander   Janice Norrie, MD 01/14/14 [phone]

## 2014-01-14 NOTE — ED Notes (Signed)
Patient presents today with a chief complaint of left ear pain that began Wednesday with worsening pain today. Patient also reports sore throat with postnasal drip and a fever on Thursday.

## 2014-01-14 NOTE — ED Provider Notes (Signed)
CSN: [id number]     Arrival date & time 01/14/14  1315 History  This chart was scribed for non-physician practitioner, Irena Cords, PA-C,working with Janice Norrie, MD, by Marlowe Kays, ED Scribe.  This patient was seen in room WTR7/WTR7 and the patient's care was started at 1:42 PM.  Chief Complaint  Patient presents with  . Otalgia  . Sore Throat   The history is provided by the patient. No language interpreter was used.   HPI Comments:  Mary Miles is a 43 y.o. female who presents to the Emergency Department complaining of left ear pan that started four days ago. Pt reports associated cough, postnasal drip, and fever (Tmax 101 degrees). She reports post-tussive vomiting intermittently since onset of symptoms with associated diarrhea. Pt states she has taken Tylenol, Mucinex, and Ibuprofen with only mild relief. She states she has tubes in her ears. Pt states she smokes approximately 0.5 PPD.    History reviewed. No pertinent past medical history. Past Surgical History  Procedure Laterality Date  . Cholecystectomy    . Abdominal hysterectomy     No family history on file. History  Substance Use Topics  . Smoking status: Current Every Day Smoker -- 1.00 packs/day for 4 years    Types: Cigarettes  . Smokeless tobacco: Not on file  . Alcohol Use: No   OB History   Grav Para Term Preterm Abortions TAB SAB Ect Mult Living                 Review of Systems A complete 10 system review of systems was obtained and all systems are negative except as noted in the HPI and PMH.   Allergies  Review of patient's allergies indicates no known allergies.  Home Medications   Current Outpatient Rx  Name  Route  Sig  Dispense  Refill  . antipyrine-benzocaine (AURALGAN) otic solution   Right Ear   Place 3 drops into the right ear every 2 (two) hours as needed for ear pain.   10 mL   0   . Aspirin-Acetaminophen-Caffeine (GOODYS EXTRA STRENGTH) 500-325-65 MG PACK   Oral   Take 1  packet by mouth every 8 (eight) hours as needed (pain).         Marland Kitchen HYDROcodone-acetaminophen (NORCO/VICODIN) 5-325 MG per tablet   Oral   Take 1 tablet by mouth every 4 (four) hours as needed.   15 tablet   0   . neomycin-polymyxin-hydrocortisone (CORTISPORIN) 3.5-10000-1 otic suspension   Left Ear   Place 4 drops into the left ear 4 (four) times daily. X 7 days   10 mL   0    Triage Vitals: BP 106/88  Pulse 97  Temp(Src) 98 F (36.7 C) (Oral)  Resp 20  SpO2 98% Physical Exam  Nursing note and vitals reviewed. Constitutional: She is oriented to person, place, and time. She appears well-developed and well-nourished.  HENT:  Head: Normocephalic and atraumatic.  Right Ear: Hearing, tympanic membrane, external ear and ear canal normal.  Left Ear: Hearing and ear canal normal. There is tenderness. No drainage or swelling. A middle ear effusion is present.  Eyes: EOM are normal.  Neck: Normal range of motion.  Cardiovascular: Normal rate, regular rhythm and normal heart sounds.  Exam reveals no gallop and no friction rub.   No murmur heard. Pulmonary/Chest: Effort normal and breath sounds normal. No respiratory distress. She has no wheezes. She has no rales.  Musculoskeletal: Normal range of motion.  Neurological:  She is alert and oriented to person, place, and time.  Skin: Skin is warm and dry.  Psychiatric: She has a normal mood and affect. Her behavior is normal.    ED Course  Procedures (including critical care time) DIAGNOSTIC STUDIES: Oxygen Saturation is 98% on RA, normal by my interpretation.   COORDINATION OF CARE: 1:47 PM- Will order CXR. Pt verbalizes understanding and agrees to plan.  Medications - No data to display  Labs Review Labs Reviewed - No data to display Imaging Review Dg Chest 2 View  01/14/2014   CLINICAL DATA:  Short of breath.  Cough  EXAM: CHEST  2 VIEW  COMPARISON:  11/23/2012  FINDINGS: Normal heart size. Clear lungs. Bronchitic changes.  Mild hyperaeration.  IMPRESSION: No active cardiopulmonary disease.   Electronically Signed   By: Maryclare Bean M.D.   On: 01/14/2014 14:52   Patient be treated for viral URI.  Told to return here as needed.  She is advised to increase her fluid intake, rest as much possible  I personally performed the services described in this documentation, which was scribed in my presence. The recorded information has been reviewed and is accurate.    [place]rent [place], PA-C 01/14/14 [phone]

## 2014-01-24 ENCOUNTER — Encounter: Payer: Self-pay | Admitting: Internal Medicine

## 2014-01-24 ENCOUNTER — Telehealth: Payer: Self-pay | Admitting: Internal Medicine

## 2014-01-24 ENCOUNTER — Ambulatory Visit (INDEPENDENT_AMBULATORY_CARE_PROVIDER_SITE_OTHER): Payer: BC Managed Care – PPO | Admitting: Internal Medicine

## 2014-01-24 VITALS — BP 120/78 | HR 96 | Temp 98.7°F | Ht 65.0 in | Wt 193.2 lb

## 2014-01-24 DIAGNOSIS — Z0000 Encounter for general adult medical examination without abnormal findings: Secondary | ICD-10-CM

## 2014-01-24 DIAGNOSIS — M25559 Pain in unspecified hip: Secondary | ICD-10-CM

## 2014-01-24 DIAGNOSIS — G8929 Other chronic pain: Secondary | ICD-10-CM

## 2014-01-24 MED ORDER — HYDROCODONE-ACETAMINOPHEN 5-325 MG PO TABS: 1.0000 | ORAL_TABLET | Freq: Every evening | ORAL | Status: DC | PRN

## 2014-01-24 MED ORDER — MELOXICAM 7.5 MG PO TABS: 7.5000 mg | ORAL_TABLET | Freq: Every day | ORAL | Status: DC

## 2014-01-24 MED ORDER — TIZANIDINE HCL 4 MG PO CAPS: 4.0000 mg | ORAL_CAPSULE | Freq: Every evening | ORAL | Status: DC | PRN

## 2014-01-24 NOTE — Progress Notes (Signed)
Pre visit review using our clinic review tool, if applicable. No additional management support is needed unless otherwise documented below in the visit note. 

## 2014-01-24 NOTE — Progress Notes (Signed)
HPI: Pt presents today to establish care. Pt moved her from [place], [place]. Pt does has chronic pain issues due to fall and orthopedic concerns. She was going to a pain clinic in [place], [place]. She has concerns today regarding her pain in her hips. She has been unable to obtain any of her prescriptive medications. Other concerns is in regards to her ears. She has Eustachian tubes placed in 2013 for increased ear pain and fluid, however since her surgery the tubes have collapsed and causing increased pain. She was seen and treated in the ER 2/21 for an otitis infection. She does endorses diet and exercise. She is very mobile with her job and states she feel she eats a balanced diet. She denies chest pain or shortness of breath.   LMP: Hysterectomy 2013 Pap smear: 2013 Mammogram: 2013 Tetanus: 2008 Flu shot: never Eye exam: 2013 Dentist: 2013  Past Medical History  Diagnosis Date  . Cervical cancer 1992  . Chicken pox   . Migraines   . GERD (gastroesophageal reflux disease)   . Macular degeneration   :.    No Known Allergies  No family history on file.  History   Social History  . Marital Status: Divorced    Spouse Name: N/A    Number of Children: N/A  . Years of Education: N/A   Occupational History  . Not on file.   Social History Main Topics  . Smoking status: Current Every Day Smoker -- 1.00 packs/day for 4 years    Types: Cigarettes  . Smokeless tobacco: Not on file  . Alcohol Use: No  . Drug Use:   . Sexual Activity:    Other Topics Concern  . Not on file   Social History Narrative  . No narrative on file    ROS:  Constitutional: Denies fever, malaise, fatigue, headache or abrupt weight changes.  HEENT:Endorses ear pain L>R. Denies eye pain, eye redness,  ringing in the ears, wax buildup, runny nose, nasal congestion, bloody nose, or sore throat. Respiratory: Denies difficulty breathing, shortness of breath, cough or sputum production.   Cardiovascular: Denies  chest pain, chest tightness, palpitations or swelling in the hands or feet.  Gastrointestinal: Denies abdominal pain, bloating, constipation, diarrhea or blood in the stool.  GU: Denies frequency, urgency, pain with urination, blood in urine, odor or discharge. Musculoskeletal: Denies decrease in range of motion, difficulty with gait, muscle pain or joint pain and swelling.  Skin: Denies redness, rashes, lesions or ulcercations.  Neurological: Denies dizziness, difficulty with memory, difficulty with speech or problems with balance and coordination.   No other specific complaints in a complete review of systems (except as listed in HPI above).  PE:  There were no vitals taken for this visit. Wt Readings from Last 3 Encounters:  07/19/13 150 lb (68.04 kg)    General: Appears their stated age, overweight, well developed, well nourished in NAD. HEENT: Head: normal shape and size; Eyes: sclera white, no icterus, conjunctiva pink, PERRLA and EOMs intact; Ears:Right ear: Tm's gray and intact, normal light reflex; Left ear: clear serous fluid present near TM; TMs gray and intact, normal light reflex. Nose: mucosa pink and moist, septum midline; Throat/Mouth: Teeth present, mucosa pink and moist, no lesions or ulcerations noted.  Neck: Normal range of motion. Neck supple, trachea midline. No massses, lumps or thyromegaly present.  Cardiovascular: Normal rate and rhythm. S1,S2 noted.  No murmur, rubs or gallops noted. No JVD or BLE edema. No carotid bruits noted. Pulmonary/Chest:  Normal effort and positive vesicular breath sounds. No respiratory distress. No wheezes, rales or ronchi noted.  Abdomen: Soft and nontender. Normal bowel sounds, no bruits noted. No distention or masses noted. Liver, spleen and kidneys non palpable. Musculoskeletal: Normal range of motion. No signs of joint swelling. No difficulty with gait.  Neurological: Alert and oriented. Cranial nerves II-XII intact. Coordination normal.  +DTRs bilaterally. Psychiatric: Mood and affect normal. Behavior is normal. Judgment and thought content normal.   EKG:  BMET No results found for this basename: na, k, cl, co2, glucose, bun, creatinine, calcium, gfrnonaa, gfraa    Lipid Panel  No results found for this basename: chol, trig, hdl, cholhdl, vldl, ldlcalc    CBC No results found for this basename: wbc, rbc, hgb, hct, plt, mcv, mch, mchc, rdw, neutrabs, lymphsabs, monoabs, eosabs, basosabs    Hgb A1C No results found for this basename: HGBA1C     Assessment and Plan: Left ear otalgia: Referred to ENT   Chronic pain Referred to pain clinic  Health maintenance: Stop smoking Continue diet and exercise Refer to ophthalmology Refer for mammogram Will obtain labs for CMET, CBC, lipid panel Follow up in one month for pap smear  Viraj Liby, Demetrius Charity

## 2014-01-24 NOTE — Patient Instructions (Addendum)

## 2014-01-24 NOTE — Telephone Encounter (Signed)
Relevant patient education assigned to patient using Emmi. ° °

## 2014-01-24 NOTE — Progress Notes (Signed)
Subjective:    Patient ID: Mary Miles, female    DOB: Feb 28, 1971, 43 y.o.   MRN: [id number]  HPI  Pt presents to the clinic today establish care. She is transferring care from Dr. Glendale Chard in [place]. Her last CPE was 2012. She does reports that she needs referral to ENT, pain management and her mammogram. She does request some pain medication until she visits pain management. She was on Mobic, Zanaflex and percocet.  LMP: Hysterectomy 2013 Pap smear: 2013 Mammogram: 2013 Tetanus: 2008 Flu shot: never Eye exam: 2013 Dentist: 2013  Review of Systems      Past Medical History  Diagnosis Date  . Cervical cancer 1992  . Chicken pox   . Migraines   . GERD (gastroesophageal reflux disease)     Current Outpatient Prescriptions  Medication Sig Dispense Refill  . acetaminophen (TYLENOL) 500 MG tablet Take 1,000 mg by mouth every 6 (six) hours as needed.      Marland Kitchen ibuprofen (ADVIL,MOTRIN) 200 MG tablet Take 400 mg by mouth every 6 (six) hours as needed for moderate pain.       No current facility-administered medications for this visit.    No Known Allergies  Family History  Problem Relation Age of Onset  . Arthritis Mother   . Hyperlipidemia Mother   . Heart disease Mother   . Hypertension Mother   . Mental illness Mother   . Hyperlipidemia Father   . Heart disease Father   . Stroke Father   . Hypertension Father   . Mental illness Father   . Diabetes Father   . Alcohol abuse Maternal Grandmother     History   Social History  . Marital Status: Divorced    Spouse Name: N/A    Number of Children: N/A  . Years of Education: N/A   Occupational History  . Not on file.   Social History Main Topics  . Smoking status: Current Every Day Smoker -- 0.50 packs/day for 4 years    Types: Cigarettes  . Smokeless tobacco: Not on file  . Alcohol Use: No  . Drug Use: No  . Sexual Activity: Not on file   Other Topics Concern  . Not on file   Social History  Narrative  . No narrative on file     Constitutional: Denies fever, malaise, fatigue, headache or abrupt weight changes.  HEENT: Pt reports bilateral ear pain. Denies eye pain, eye redness, ringing in the ears, wax buildup, runny nose, nasal congestion, bloody nose, or sore throat. Respiratory: Denies difficulty breathing, shortness of breath, cough or sputum production.   Cardiovascular: Denies chest pain, chest tightness, palpitations or swelling in the hands or feet.  Gastrointestinal: Denies abdominal pain, bloating, constipation, diarrhea or blood in the stool.  GU: Denies urgency, frequency, pain with urination, burning sensation, blood in urine, odor or discharge. Musculoskeletal: Pt reports bilateral hip pain. Denies decrease in range of motion, difficulty with gait, muscle pain or joint pain and swelling.  Skin: Denies redness, rashes, lesions or ulcercations.  Neurological: Denies dizziness, difficulty with memory, difficulty with speech or problems with balance and coordination.   No other specific complaints in a complete review of systems (except as listed in HPI above).  Objective:   Physical Exam   BP 120/78  Pulse 96  Temp(Src) 98.7 F (37.1 C) (Oral)  Ht 5\' 5"  (1.651 m)  Wt 193 lb 4 oz (87.658 kg)  BMI 32.16 kg/m2  SpO2 99% Wt Readings from  Last 3 Encounters:  01/24/14 193 lb 4 oz (87.658 kg)  07/19/13 150 lb (68.04 kg)    General: Appears her stated age, overweight but well developed, well nourished in NAD. Skin: Warm, dry and intact. No rashes, lesions or ulcerations noted. HEENT: Head: normal shape and size; Eyes: sclera white, no icterus, conjunctiva pink, PERRLA and EOMs intact; Ears: Tm's gray and intact, normal light reflex, + effusions bilaterally; Nose: mucosa pink and moist, septum midline; Throat/Mouth: Teeth present, mucosa pink and moist, no exudate, lesions or ulcerations noted.  Neck: Normal range of motion. Neck supple, trachea midline. No  massses, lumps or thyromegaly present.  Cardiovascular: Normal rate and rhythm. S1,S2 noted.  No murmur, rubs or gallops noted. No JVD or BLE edema. No carotid bruits noted. Pulmonary/Chest: Normal effort and positive vesicular breath sounds. No respiratory distress. No wheezes, rales or ronchi noted.  Abdomen: Soft and nontender. Normal bowel sounds, no bruits noted. No distention or masses noted. Liver, spleen and kidneys non palpable. Musculoskeletal: Normal range of motion. No signs of joint swelling. No difficulty with gait.  Neurological: Alert and oriented. Cranial nerves II-XII intact. Coordination normal. +DTRs bilaterally. Psychiatric: Mood and affect normal. Behavior is normal. Judgment and thought content normal.         Assessment & Plan:  Left ear otalgia:  Referred to ENT   Chronic pain  Referred to pain clinic Low dose vicodin, zanaflex and mobic ordered  Health maintenance:  Stop smoking Continue diet and exercise Refer to ophthalmology Refer for mammogram Will obtain labs for CMET, CBC, lipid panel  Follow up in one month for pap smear

## 2014-01-25 LAB — CBC
HCT: 41.6 % (ref 36.0–46.0)
Hemoglobin: 14.1 g/dL (ref 12.0–15.0)
MCHC: 33.8 g/dL (ref 30.0–36.0)
MCV: 94.4 fl (ref 78.0–100.0)
PLATELETS: 261 10*3/uL (ref 150.0–400.0)
RBC: 4.41 Mil/uL (ref 3.87–5.11)
RDW: 13.5 % (ref 11.5–14.6)
WBC: 9.6 10*3/uL (ref 4.5–10.5)

## 2014-01-25 LAB — COMPREHENSIVE METABOLIC PANEL
ALBUMIN: 4.3 g/dL (ref 3.5–5.2)
ALT: 25 U/L (ref 0–35)
AST: 20 U/L (ref 0–37)
Alkaline Phosphatase: 51 U/L (ref 39–117)
BUN: 19 mg/dL (ref 6–23)
CALCIUM: 9.4 mg/dL (ref 8.4–10.5)
CO2: 28 meq/L (ref 19–32)
Chloride: 104 mEq/L (ref 96–112)
Creatinine, Ser: 0.8 mg/dL (ref 0.4–1.2)
GFR: 86.04 mL/min (ref >60.00)
GLUCOSE: 76 mg/dL (ref 70–99)
POTASSIUM: 4.9 meq/L (ref 3.5–5.1)
SODIUM: 139 meq/L (ref 135–145)
TOTAL PROTEIN: 7.5 g/dL (ref 6.0–8.3)
Total Bilirubin: 0.7 mg/dL (ref 0.3–1.2)

## 2014-01-25 LAB — LIPID PANEL
Cholesterol: 136 mg/dL (ref 0–200)
HDL: 44.5 mg/dL (ref >39.00)
LDL Cholesterol: 74 mg/dL (ref 0–99)
Total CHOL/HDL Ratio: 3
Triglycerides: 87 mg/dL (ref 0.0–149.0)
VLDL: 17.4 mg/dL (ref 0.0–40.0)

## 2014-01-25 LAB — HEMOGLOBIN A1C: HEMOGLOBIN A1C: 4.7 % (ref 4.6–6.5)

## 2014-01-25 LAB — TSH: TSH: 3.24 u[IU]/mL (ref 0.35–5.50)

## 2014-02-17 ENCOUNTER — Ambulatory Visit: Payer: BC Managed Care – PPO

## 2014-02-24 ENCOUNTER — Inpatient Hospital Stay: Admission: RE | Admit: 2014-02-24 | Payer: BC Managed Care – PPO | Source: Ambulatory Visit

## 2014-03-17 ENCOUNTER — Other Ambulatory Visit: Payer: Self-pay

## 2014-03-17 DIAGNOSIS — G8929 Other chronic pain: Secondary | ICD-10-CM

## 2014-03-17 DIAGNOSIS — M25559 Pain in unspecified hip: Secondary | ICD-10-CM

## 2014-03-17 DIAGNOSIS — Z0000 Encounter for general adult medical examination without abnormal findings: Secondary | ICD-10-CM

## 2014-03-17 MED ORDER — HYDROCODONE-ACETAMINOPHEN 5-325 MG PO TABS: 1.0000 | ORAL_TABLET | Freq: Every evening | ORAL | Status: DC | PRN

## 2014-03-17 NOTE — Telephone Encounter (Signed)
Pt has appt with [facility] on 03/30/14 and pt understands that pain mgt will not write pain meds on first visit and advised pt to get pain med from PCP until established with pain mgt. Pt request rx hydrocodone apap. Call when ready for pick up.

## 2014-03-17 NOTE — Telephone Encounter (Signed)
Rx left in front office for pick up and pt is aware  

## 2014-04-28 ENCOUNTER — Other Ambulatory Visit: Payer: Self-pay

## 2014-04-28 DIAGNOSIS — Z0000 Encounter for general adult medical examination without abnormal findings: Secondary | ICD-10-CM

## 2014-04-28 DIAGNOSIS — M25559 Pain in unspecified hip: Secondary | ICD-10-CM

## 2014-04-28 DIAGNOSIS — G8929 Other chronic pain: Secondary | ICD-10-CM

## 2014-04-28 NOTE — Telephone Encounter (Signed)
Pt left another v/m requesting cb to verify message was received. Left v.m for pt to cb.

## 2014-04-28 NOTE — Telephone Encounter (Signed)
Pt left v/m; pt is trying to get appt with pain mgt; Marlynn Perking is trying to schedule pt with [facility]; pt request rx hydrocodone apap.Please advise.

## 2014-04-28 NOTE — Telephone Encounter (Signed)
Pt called back and pt said she requested the refill of hydrocodone 2 weeks ago;i explained to pt I am not saying she did not call and request med but I did not get message and if she calls again and does not get a prompt reply to call me back and please do not wait 2 weeks when she is out of med. Pt said she would wait until Mon.

## 2014-05-01 MED ORDER — HYDROCODONE-ACETAMINOPHEN 5-325 MG PO TABS: 1.0000 | ORAL_TABLET | Freq: Every evening | ORAL | Status: DC | PRN

## 2014-05-01 NOTE — Telephone Encounter (Signed)
Rx left in front office for pick up and left detailed msg per HIPAA on VM letting pt know Rx is ready for pick up

## 2014-05-29 ENCOUNTER — Other Ambulatory Visit: Payer: Self-pay

## 2014-05-29 DIAGNOSIS — Z0000 Encounter for general adult medical examination without abnormal findings: Secondary | ICD-10-CM

## 2014-05-29 MED ORDER — HYDROCODONE-ACETAMINOPHEN 5-325 MG PO TABS: 1.0000 | ORAL_TABLET | Freq: Every evening | ORAL | Status: DC | PRN

## 2014-05-29 NOTE — Telephone Encounter (Signed)
Pt left v/m requesting rx hydrocodone apap. Call when ready for pick up. Pt still waiting on appt with [facility].Pt has 1 pill left.

## 2014-05-29 NOTE — Telephone Encounter (Signed)
Spoke with patient and advised rx ready for pick-up and it will be at the front desk.  

## 2014-06-28 ENCOUNTER — Other Ambulatory Visit: Payer: Self-pay

## 2014-06-28 DIAGNOSIS — Z0000 Encounter for general adult medical examination without abnormal findings: Secondary | ICD-10-CM

## 2014-06-28 NOTE — Telephone Encounter (Signed)
Pt left v/m requesting rx hydrocodone apap. Call when ready for pick up. Pt still waiting on pain mgt appt.

## 2014-06-29 MED ORDER — HYDROCODONE-ACETAMINOPHEN 5-325 MG PO TABS: 1.0000 | ORAL_TABLET | Freq: Every evening | ORAL | Status: DC | PRN

## 2014-06-29 NOTE — Telephone Encounter (Signed)
Message left notifying patient. Rx placed up front for pick up. 

## 2014-07-12 ENCOUNTER — Encounter (HOSPITAL_COMMUNITY): Payer: Self-pay | Admitting: Emergency Medicine

## 2014-07-12 ENCOUNTER — Emergency Department (HOSPITAL_COMMUNITY)
Admission: EM | Admit: 2014-07-12 | Discharge: 2014-07-12 | Disposition: A | Discharge status: Home / Self Care | Payer: BC Managed Care – PPO | Attending: Emergency Medicine | Admitting: Emergency Medicine

## 2014-07-12 DIAGNOSIS — R110 Nausea: Secondary | ICD-10-CM | POA: Insufficient documentation

## 2014-07-12 DIAGNOSIS — Z90710 Acquired absence of both cervix and uterus: Secondary | ICD-10-CM | POA: Insufficient documentation

## 2014-07-12 DIAGNOSIS — Z8669 Personal history of other diseases of the nervous system and sense organs: Secondary | ICD-10-CM | POA: Diagnosis not present

## 2014-07-12 DIAGNOSIS — F17200 Nicotine dependence, unspecified, uncomplicated: Secondary | ICD-10-CM | POA: Insufficient documentation

## 2014-07-12 DIAGNOSIS — Z791 Long term (current) use of non-steroidal anti-inflammatories (NSAID): Secondary | ICD-10-CM | POA: Diagnosis not present

## 2014-07-12 DIAGNOSIS — Z8719 Personal history of other diseases of the digestive system: Secondary | ICD-10-CM | POA: Diagnosis not present

## 2014-07-12 DIAGNOSIS — K089 Disorder of teeth and supporting structures, unspecified: Secondary | ICD-10-CM | POA: Diagnosis not present

## 2014-07-12 DIAGNOSIS — Z8619 Personal history of other infectious and parasitic diseases: Secondary | ICD-10-CM | POA: Diagnosis not present

## 2014-07-12 DIAGNOSIS — Z8541 Personal history of malignant neoplasm of cervix uteri: Secondary | ICD-10-CM | POA: Diagnosis not present

## 2014-07-12 DIAGNOSIS — J029 Acute pharyngitis, unspecified: Secondary | ICD-10-CM | POA: Insufficient documentation

## 2014-07-12 DIAGNOSIS — K0889 Other specified disorders of teeth and supporting structures: Secondary | ICD-10-CM

## 2014-07-12 MED ORDER — ONDANSETRON 4 MG PO TBDP
4.0000 mg | ORAL_TABLET | Freq: Once | ORAL | Status: AC
  Administered 2014-07-12: 4 mg via ORAL
  Filled 2014-07-12: qty 1

## 2014-07-12 MED ORDER — HYDROCODONE-ACETAMINOPHEN 5-325 MG PO TABS: 1.0000 | ORAL_TABLET | ORAL | Status: DC | PRN

## 2014-07-12 MED ORDER — HYDROCODONE-ACETAMINOPHEN 5-325 MG PO TABS
2.0000 | ORAL_TABLET | Freq: Once | ORAL | Status: AC
  Administered 2014-07-12: 2 via ORAL
  Filled 2014-07-12: qty 2

## 2014-07-12 MED ORDER — LIDOCAINE VISCOUS 2 % MT SOLN
15.0000 mL | Freq: Once | OROMUCOSAL | Status: AC
  Administered 2014-07-12: 15 mL via OROMUCOSAL
  Filled 2014-07-12: qty 15

## 2014-07-12 MED ORDER — ONDANSETRON HCL 4 MG PO TABS: 4.0000 mg | ORAL_TABLET | Freq: Three times a day (TID) | ORAL | Status: DC | PRN

## 2014-07-12 NOTE — ED Notes (Signed)
Pt states she noticed a broken tooth to left lower jaw onset this am upon waking. Pt states she has soreness when swallowing with swelling noted to jaw.

## 2014-07-12 NOTE — Discharge Instructions (Signed)
Take Hydrocodone 1-2 pills as needed every 4-6 hours for pain.  Use Zofran as needed for nausea.  Follow up with Dentistry and refer to resource guide.     Dental Pain A tooth ache may be caused by cavities (tooth decay). Cavities expose the nerve of the tooth to air and hot or cold temperatures. It may come from an infection or abscess (also called a boil or furuncle) around your tooth. It is also often caused by dental caries (tooth decay). This causes the pain you are having. DIAGNOSIS  Your caregiver can diagnose this problem by exam. TREATMENT   If caused by an infection, it may be treated with medications which kill germs (antibiotics) and pain medications as prescribed by your caregiver. Take medications as directed.  Only take over-the-counter or prescription medicines for pain, discomfort, or fever as directed by your caregiver.  Whether the tooth ache today is caused by infection or dental disease, you should see your dentist as soon as possible for further care. SEEK MEDICAL CARE IF: The exam and treatment you received today has been provided on an emergency basis only. This is not a substitute for complete medical or dental care. If your problem worsens or new problems (symptoms) appear, and you are unable to meet with your dentist, call or return to this location. SEEK IMMEDIATE MEDICAL CARE IF:   You have a fever.  You develop redness and swelling of your face, jaw, or neck.  You are unable to open your mouth.  You have severe pain uncontrolled by pain medicine. MAKE SURE YOU:   Understand these instructions.  Will watch your condition.  Will get help right away if you are not doing well or get worse. Document Released: 11/10/2005 Document Revised: 02/02/2012 Document Reviewed: 06/28/2008 [facility] Patient Information 2015 [place], [place]. This information is not intended to replace advice given to you by your health care provider. Make sure you discuss any questions you  have with your health care provider.    Emergency Department Resource Guide 1) Find a Doctor and Pay Out of Pocket Although you won't have to find out who is covered by your insurance plan, it is a good idea to ask around and get recommendations. You will then need to call the office and see if the doctor you have chosen will accept you as a new patient and what types of options they offer for patients who are self-pay. Some doctors offer discounts or will set up payment plans for their patients who do not have insurance, but you will need to ask so you aren't surprised when you get to your appointment.  2) Contact Your Local Health Department Not all health departments have doctors that can see patients for sick visits, but many do, so it is worth a call to see if yours does. If you don't know where your local health department is, you can check in your phone book. The CDC also has a tool to help you locate your state's health department, and many state websites also have listings of all of their local health departments.  3) Find a [facility] If your illness is not likely to be very severe or complicated, you may want to try a walk in clinic. These are popping up all over the country in pharmacies, drugstores, and shopping centers. They're usually staffed by nurse practitioners or physician assistants that have been trained to treat common illnesses and complaints. They're usually fairly quick and inexpensive. However, if you have  serious medical issues or chronic medical problems, these are probably not your best option.  No Primary Care Doctor: - Call Health Connect at  [phone] - they can help you locate a primary care doctor that  accepts your insurance, provides certain services, etc. - Physician Referral Service- [phone]  Chronic Pain Problems: Organization         Address  Phone   Notes  [facility]  [phone] Patients need to be referred by their  primary care doctor.   Medication Assistance: Organization         Address  Phone   Notes  [facility] [address] [place], [place] [address] [phone] --Must be a resident of [facility] -- Must have NO insurance coverage whatsoever (no Medicaid/ Medicare, etc.) -- The pt. MUST have a primary care doctor that directs their care regularly and follows them in the community   Med[facility]ist  [phone]   [employer]  [phone]    Agencies that provide inexpensive medical care: Organization         Address  Phone   Notes  [place]  [phone]   [facility]    [phone]   [facility] [place], [place] [address] [phone]   [place] [address] [place] [phone]   Planned Parenthood    [phone]   [facility]    [phone]   [place] and [place] [address], [place] Phone:  [phone], Fax:  [phone] Hours of Operation:  9 am - 6 pm, M-F.  Also accepts Medicaid/Medicare and self-pay.  [facility] for [place], [address], [place] Phone: [phone], Fax: [phone]. Hours of Operation:  8:30 am - 5:30 pm, M-F.  Also accepts Medicaid and self-pay.  [facility] [place] [address] [place] Phone: [phone]   [place], [place], [place] [phone], Ext. [phone] Mondays & Thursdays: 7-9 AM.  First 15 patients are seen on a first come, first serve basis.    [place] Providers:  Organization         Address  Phone   Notes  [facility] [address], [place] [phone] Also accepts self-pay patients.  [facility] [address], [place]  [phone]   [place], [address], [place] [phone]   [facility] [address] [place] [phone]   Lucianne Lei [address], [place]   [phone] Only accepts [place] Access [place] patients after they have their name applied to their card.   Self-Pay (no insurance) in [facility]:  Organization         Address  Phone   Notes  Sickle Cell Patients, [facility] [place] [phone]   [facility] [place] [phone]   [facility] [place]  [place], [place], [place] [phone]   [facility]/Dr. Osei-Bonsu  [address] [place] or [address], [place] [phone] Phone number for both [place] and [place] locations  is the same.  Urgent Medical and [facility] [address], [place] [phone]   [facility] [place] [address], [place] or [address] [phone]   [facility] [address] [place] [phone], phone; [phone], fax Sees patients 1st and 3rd Saturday of every month.  Must not qualify for public or private insurance (i.e. Medicaid, Medicare, [place] Health Choice, Veterans' Benefits)  Household income should be no more than 200% of the poverty level The clinic cannot treat you if you are pregnant or think you are pregnant  Sexually transmitted diseases are not treated at the clinic.    Dental Care: Organization         Address  Phone  Notes  [facility] Department of [facility] [place] [phone] Accepts children up to age 67 who are enrolled in [place] or [place]; pregnant women with a Medicaid card; and children who have applied for Medicaid or [place] Health Choice, but were declined, whose parents can pay a reduced fee  at time of service.  [facility] Department of [facility]  [address], [place] [phone] Accepts children up to age 79 who are enrolled in [place] or [place]; pregnant women with a Medicaid card; and children who have applied for Medicaid or [place] Health Choice, but were declined, whose parents can pay a reduced fee at time of service.  [place] Adult Dental Access PROGRAM  [place] [phone] Patients are seen by appointment only. Walk-ins are not accepted. [place] will see patients 37 years of age and older. Monday - Tuesday (8am-5pm) Most Wednesdays (8:30-5pm) $30 per visit, cash only  [facility] Adult Dental Access PROGRAM  [address], [facility] [phone] Patients are seen by appointment only. Walk-ins are not accepted. [place] will see patients 82 years of age and older. One Wednesday Evening (Monthly: Volunteer Based).  $30 per visit, cash only  [place]  [phone] for adults; Children under age 11, call Graduate Pediatric Dentistry at [phone]. Children aged 81-14, please call [phone] to request a pediatric application.  Dental services are provided in all areas of dental care including fillings, crowns and bridges, complete and partial dentures, implants, gum treatment, root canals, and extractions. Preventive care is also provided. Treatment is provided to both adults and children. Patients are selected via a lottery and there is often a waiting list.   [facility] [address], [place]  [phone] [email or web address]   [facility] [address] [place], [place] [phone], Ext. [phone] Second and Fourth Thursday of each month, opens at 6:30 AM; Clinic ends at 9 AM.  Patients are seen on a first-come first-served basis, and a limited number are seen during each clinic.   [facility]  [address] Hillard Danker [place], [place] [phone]   Eligibility Requirements You must have lived in [place], [place], or [place] counties for at least the last three months.   You cannot be eligible for state or federal sponsored [employer], including [employer], [place], or [employer].   You generally cannot be eligible for healthcare insurance through your employer.    How to apply: Eligibility screenings are held every Tuesday and Wednesday afternoon from 1:00 pm until 4:00 pm. You do not need an  appointment for the interview!  [facility] [address], [place], [place]   [place]  [place] Department  [place]  [phone]    Behavioral Health Resources in the Community: Intensive Outpatient Programs Organization         Address  Phone  Notes  [place]. [address], [place], [place] [phone]   [facility] Outpatient [address], [place], [place]   ADS: Alcohol & Drug Svcs [address] [place], [place]   [place] [address]  [place], [place] or [phone]   Substance Abuse Resources Organization         Address  Phone  Notes  Alcohol and Drug Services  [phone]   [place]  [phone]   [facility] [place]   Chinita Pester  [phone]   Residential & Outpatient Substance Abuse Program  [phone]   Psychological Services Organization         Address  Phone  Notes  [facility] [place]  [place]  [phone]   [place] [address] [place] or [phone]    Mobile Crisis Teams Organization         Address  Phone  Notes  [facility], Mobile Crisis Care Unit  [phone]   Assertive Psychotherapeutic  Services  [address] [place], [place]   [place] [address], [place] [phone]    Self-Help/Support Groups Organization         Address  Phone             Notes  [place]. of [place] - variety of support groups  [place] Call for more information  Narcotics Anonymous (NA), [facility] [address], [employer] [place]  2 meetings at this location   Special educational needs teacher         Address  Phone  Notes  [facility]AP Residential Treatment [place],    [place]  [phone]   [facility]  [address], [place] [address], [place], [place]   [place], [place] [phone] Admissions: 8am-3pm M-F  Incentives Substance [place] [address]    [place], [place] [phone]   [facility] [address] [place], [place], [place]   [facility] [address]  [place], [place]   [facility] - Intensive Outpatient [address], [place], [place]   [facility] [place].,  [place], [place] [phone] or [phone]   Residential Treatment Services (RTS) [address], [place], [place] Accepts Medicaid  [facility] [place] [address]  [place] [phone] Substance Abuse/Addiction Treatment   [facility] Organization         Address  Phone  Notes  [facility]  [phone]   Domenic Schwab, PhD [address] [place], [place]   [phone] or [phone]   [place], [place] [phone]   [place] [address], [place], [place] [phone] Insurance/Medicaid/sponsorship through [employer] and Families [address] [place] [address]  [place], [place] [phone] [place], [place] [phone]    Dr. Adele Schilder  [phone]   [facility] Dept. 1) [address] [place] 2) [place] 3)  [address], [place] [phone]  [phone]   [place] [phone] or [phone] (After Hours)

## 2014-07-12 NOTE — ED Provider Notes (Signed)
CSN: [id number]     Arrival date & time 07/12/14  1533 History  This chart was scribed for non-physician practitioner, Dahlia Bailiff, PA-C working with Arbie Cookey, MD by Frederich Balding, ED scribe. This patient was seen in room WTR6/WTR6 and the patient's care was started at 3:37 PM.   Chief Complaint  Patient presents with  . Dental Pain   The history is provided by the patient. No language interpreter was used.   HPI Comments: Mary Miles is a 43 y.o. female who presents to the Emergency Department complaining of gradual onset, throbbing, left lower dental pain that started this morning when she woke up. Pt thinks she broke her tooth yesterday and noticed some sharp pains yesterday, however she states the pain today became acutely worse.  States she noticed this morning that the filling in the tooth was gone. Reports associated sore throat on the left side of her throat and nausea. Denies fever, trouble swallowing, emesis. Pt smokes cigarettes daily.   Past Medical History  Diagnosis Date  . Cervical cancer 1992  . Chicken pox   . Migraines   . GERD (gastroesophageal reflux disease)   . Macular degeneration    Past Surgical History  Procedure Laterality Date  . Cholecystectomy    . Abdominal hysterectomy    . Tympanostomy tube placement Left    Family History  Problem Relation Age of Onset  . Arthritis Mother   . Hyperlipidemia Mother   . Heart disease Mother   . Hypertension Mother   . Mental illness Mother   . Hyperlipidemia Father   . Heart disease Father   . Stroke Father   . Hypertension Father   . Mental illness Father   . Diabetes Father   . Alcohol abuse Maternal Grandmother    History  Substance Use Topics  . Smoking status: Current Every Day Smoker -- 0.50 packs/day for 4 years    Types: Cigarettes  . Smokeless tobacco: Not on file  . Alcohol Use: No   OB History   Grav Para Term Preterm Abortions TAB SAB Ect Mult Living                  Review of Systems  Constitutional: Negative for fever.  HENT: Positive for dental problem and sore throat.   Gastrointestinal: Positive for nausea. Negative for vomiting.  All other systems reviewed and are negative.  Allergies  Review of patient's allergies indicates no known allergies.  Home Medications   Prior to Admission medications   Medication Sig Start Date End Date Taking? Authorizing Provider  acetaminophen (TYLENOL) 500 MG tablet Take 1,000 mg by mouth every 6 (six) hours as needed.   Yes Historical Provider, MD  HYDROcodone-acetaminophen (NORCO/VICODIN) 5-325 MG per tablet Take 1 tablet by mouth at bedtime as needed for moderate pain. 06/29/14  Yes Webb Silversmith, NP  ibuprofen (ADVIL,MOTRIN) 200 MG tablet Take 400 mg by mouth every 6 (six) hours as needed for moderate pain.   Yes Historical Provider, MD  meloxicam (MOBIC) 7.5 MG tablet Take 1 tablet (7.5 mg total) by mouth daily. 01/24/14  Yes Webb Silversmith, NP  tiZANidine (ZANAFLEX) 4 MG capsule Take 1 capsule (4 mg total) by mouth at bedtime as needed for muscle spasms. 01/24/14  Yes Webb Silversmith, NP  HYDROcodone-acetaminophen (NORCO/VICODIN) 5-325 MG per tablet Take 1-2 tablets by mouth every 4 (four) hours as needed for moderate pain or severe pain. 07/12/14   Carrie Mew, PA-C  ondansetron (  ZOFRAN) 4 MG tablet Take 1 tablet (4 mg total) by mouth every 8 (eight) hours as needed for nausea or vomiting. 07/12/14   Carrie Mew, PA-C   BP 117/86  Pulse 98  Temp(Src) 98.1 F (36.7 C) (Oral)  Resp 16  Ht 5\' 6"  (1.676 m)  Wt 150 lb (68.04 kg)  BMI 24.22 kg/m2  SpO2 100%  Physical Exam  Nursing note and vitals reviewed. Constitutional: She is oriented to person, place, and time. She appears well-developed and well-nourished. No distress.  HENT:  Head: Normocephalic and atraumatic.  Right Ear: Tympanic membrane and ear canal normal.  Left Ear: Tympanic membrane and ear canal normal.  Mouth/Throat: Uvula is midline,  oropharynx is clear and moist and mucous membranes are normal. Normal dentition. No dental abscesses, uvula swelling or dental caries. No oropharyngeal exudate, posterior oropharyngeal edema, posterior oropharyngeal erythema or tonsillar abscesses.  No swelling, erythema, or gross abscess noted to location of patient's pain. Patient is extremely tender in her rear left lower molars, however no broken tooth or signs of infection are visible.  Eyes: Conjunctivae and EOM are normal.  Neck: Normal range of motion and full passive range of motion without pain. Neck supple. No spinous process tenderness and no muscular tenderness present. No rigidity. No tracheal deviation and no edema present.  No adenopathy noted.   Cardiovascular: Normal rate, regular rhythm, S1 normal, S2 normal and normal heart sounds.   No murmur heard. Pulmonary/Chest: Effort normal and breath sounds normal. No accessory muscle usage. Not tachypneic. No respiratory distress. She has no wheezes. She has no rhonchi. She has no rales.  Abdominal: Soft. Normal appearance. There is no tenderness.  Musculoskeletal: Normal range of motion.  Neurological: She is alert and oriented to person, place, and time. No cranial nerve deficit or sensory deficit.  Skin: Skin is warm and dry.  Psychiatric: She has a normal mood and affect. Her behavior is normal.    ED Course  Procedures (including critical care time)  DIAGNOSTIC STUDIES: Oxygen Saturation is 100% on RA, normal by my interpretation.    COORDINATION OF CARE: 3:44 PM-Discussed treatment plan which includes an antibiotic and pain medication with pt at bedside and pt agreed to plan. Will give pt dental referrals and advised her to follow up.   Labs Review Labs Reviewed - No data to display  Imaging Review No results found.   EKG Interpretation None      MDM   Final diagnoses:  Pain, dental    Patient here with one day of dental pain status post biting down last  night and noticing CH]" in her tooth, and severe pain following. Patient states she does not have a dentist and does not have dental insurance at this time. We will treat pain with hydrocodone, topical oral lidocaine.  5:00 PM: Patient's pain mostly under control this time. Advised patient that we could provide her with pain medicine until she finds dental appointment. Would also provide her with some resources to help find a dentist. Patient is agreeable to this plan. Recurrent patient to call or return to the ER should her symptoms continue, worsen or should she have any questions or concerns.    Signed,  Dahlia Bailiff, PA-C 9:35 PM   I personally performed the services described in this documentation, which was scribed in my presence. The recorded information has been reviewed and is accurate.  Carrie Mew, PA-C 07/12/14 2135

## 2014-07-16 NOTE — ED Provider Notes (Signed)
Medical screening examination/treatment/procedure(s) were performed by non-physician practitioner and as supervising physician I was immediately available for consultation/collaboration.   EKG Interpretation None        Houston Siren III, MD 07/16/14 [phone]

## 2014-07-26 ENCOUNTER — Other Ambulatory Visit: Payer: Self-pay | Admitting: Family Medicine

## 2014-07-26 DIAGNOSIS — Z0000 Encounter for general adult medical examination without abnormal findings: Secondary | ICD-10-CM

## 2014-07-26 NOTE — Telephone Encounter (Signed)
Last filled 06/28/14

## 2014-07-27 ENCOUNTER — Other Ambulatory Visit: Payer: Self-pay

## 2014-07-27 ENCOUNTER — Encounter: Payer: Self-pay | Admitting: Internal Medicine

## 2014-07-27 DIAGNOSIS — Z0000 Encounter for general adult medical examination without abnormal findings: Secondary | ICD-10-CM

## 2014-07-27 MED ORDER — HYDROCODONE-ACETAMINOPHEN 5-325 MG PO TABS: 1.0000 | ORAL_TABLET | Freq: Every evening | ORAL | Status: DC | PRN

## 2014-07-27 NOTE — Telephone Encounter (Signed)
Rx left in front office for pick up--left detailed msg on VM per HIPAA letting pt know Rx is ready

## 2014-07-27 NOTE — Addendum Note (Signed)
Addended by: Jearld Fenton on: 07/27/2014 08:27 AM   Modules accepted: Orders

## 2014-08-18 ENCOUNTER — Telehealth: Payer: Self-pay

## 2014-08-18 DIAGNOSIS — Z9114 Patient's other noncompliance with medication regimen: Secondary | ICD-10-CM

## 2014-08-18 NOTE — Telephone Encounter (Signed)
UDS came back positive for Oxycodone and NOT Norco for which Webb Silversmith prescribes--per [place]--Why is Oxycodone in system and not Norco-- LMOVM for pt to return my call

## 2014-08-25 ENCOUNTER — Encounter: Payer: Self-pay | Admitting: Internal Medicine

## 2014-08-28 NOTE — Telephone Encounter (Signed)
Pt returned your call. Please call back at [phone]

## 2014-08-29 NOTE — Telephone Encounter (Signed)
This is not excuseable- she should separate her and her boyfriends medication. No more controlled substances will be  prescribed

## 2014-08-29 NOTE — Telephone Encounter (Signed)
Pt states she is not sure why she is negative for Norco but positive for Oxycodone as she has been taking it--however pt states her boyfriend takes Rx Oxycodone and "may have mistaken his medication for hers as she has done this before"--i did confirm with pt the Norco was NEGATIVE in system meaning there is NONE in her system from ingestion--please advise

## 2014-09-01 DIAGNOSIS — Z9114 Patient's other noncompliance with medication regimen: Secondary | ICD-10-CM | POA: Insufficient documentation

## 2014-09-01 NOTE — Telephone Encounter (Signed)
Left detailed msg on VM per HIPAA letting pt know Mary Miles will no longer prescribe controlled substances for her

## 2014-09-06 ENCOUNTER — Other Ambulatory Visit: Payer: Self-pay | Admitting: Physical Medicine and Rehabilitation

## 2014-09-06 DIAGNOSIS — M545 Low back pain, unspecified: Secondary | ICD-10-CM

## 2014-09-12 ENCOUNTER — Ambulatory Visit
Admission: RE | Admit: 2014-09-12 | Discharge: 2014-09-12 | Disposition: A | Discharge status: Home / Self Care | Payer: BC Managed Care – PPO | Source: Ambulatory Visit | Attending: Physical Medicine and Rehabilitation | Admitting: Physical Medicine and Rehabilitation

## 2014-09-12 DIAGNOSIS — M545 Low back pain, unspecified: Secondary | ICD-10-CM

## 2014-11-07 ENCOUNTER — Ambulatory Visit: Payer: BC Managed Care – PPO | Admitting: Internal Medicine

## 2014-11-08 ENCOUNTER — Encounter: Payer: Self-pay | Admitting: Family Medicine

## 2014-11-08 ENCOUNTER — Ambulatory Visit (INDEPENDENT_AMBULATORY_CARE_PROVIDER_SITE_OTHER): Payer: BC Managed Care – PPO | Admitting: Family Medicine

## 2014-11-08 VITALS — BP 94/60 | HR 95 | Temp 98.3°F | Ht 65.0 in | Wt 187.2 lb

## 2014-11-08 DIAGNOSIS — G90521 Complex regional pain syndrome I of right lower limb: Secondary | ICD-10-CM

## 2014-11-08 MED ORDER — AMITRIPTYLINE HCL 25 MG PO TABS: 25.0000 mg | ORAL_TABLET | Freq: Every day | ORAL | Status: AC

## 2014-11-08 NOTE — Progress Notes (Signed)
Dr. Frederico Hamman T. Aireal Slater, MD, [facility] [place], [address] Phone: [phone] Fax: [phone]  11/08/2014  Patient: Mary Miles, MRN: [id number], DOB: 31-Oct-1971, 43 y.o.  Primary Physician:  Webb Silversmith, NP  Chief Complaint: Toe Pain  Subjective:   Mary Miles is a 43 y.o. very pleasant female patient who presents with the following:  Earlier this year, dropped a case of 2 liter of coke on it. Had a subungal hematoma.   This appears to have happened on 07/19/2013. On review of the ER notes, and on  X-ray, they are both noted to describe the injury on the Left great toe. Patient and her boyfriend describe a R great subungal hematoma that occurred on that date. Ongoing pain issues - has been given norco and oxycodone based medications.  Received 1 month ago #120 narcotic pills from an MD not in our group. Patient violated pain contract with our group approx 3 months ago.  Past Medical History, Surgical History, Social History, Family History, Problem List, Medications, and Allergies have been reviewed and updated if relevant.  GEN: No fevers, chills. Nontoxic. Primarily MSK c/o today. MSK: Detailed in the HPI GI: tolerating PO intake without difficulty Neuro: No numbness, parasthesias, or tingling associated. Otherwise the pertinent positives of the ROS are noted above.   Objective:   BP 94/60 mmHg  Pulse 95  Temp(Src) 98.3 F (36.8 C) (Oral)  Ht 5\' 5"  (1.651 m)  Wt 187 lb 4 oz (84.936 kg)  BMI 31.16 kg/m2   GEN: WDWN, NAD, Non-toxic, Alert & Oriented x 3 HEENT: Atraumatic, Normocephalic.  Ears and Nose: No external deformity. EXTR: No clubbing/cyanosis/edema NEURO: Normal gait.  PSYCH: Normally interactive. Conversant. Not depressed or anxious appearing.  Calm demeanor.    From a bone and joint standpoint, NT at all joints and bones of R foot.  Patient is tender at the base of RIGHT great  toenail  Radiology: No results found.  Assessment and Plan:   RSD lower limb, right  Certainly all soft tissue, bone bruising, and anything associated with injury greater than 1 year ago is fully healed.  This should not require narcotics.  Potentially, the patient could have an RSD phenomenon.  We will try to titrate up TCA to see if this will help post trauma pain.  She does have a mild amount of fungus underneath her toenail, so theoretically the entire toenail bed could be removed.  Follow-up: Return in about 2 months (around 01/09/2015).  New Prescriptions   AMITRIPTYLINE (ELAVIL) 25 MG TABLET    Take 1 tablet (25 mg total) by mouth at bedtime.   Patient Instructions  Take 30 minutes before bed.   After 3-4 weeks, try to take 2 tablets before bed.     Signed,  Maud Deed. Wilberto Console, MD   Patient's Medications  New Prescriptions   AMITRIPTYLINE (ELAVIL) 25 MG TABLET    Take 1 tablet (25 mg total) by mouth at bedtime.  Previous Medications   ACETAMINOPHEN (TYLENOL) 500 MG TABLET    Take 1,000 mg by mouth every 6 (six) hours as needed.   IBUPROFEN (ADVIL,MOTRIN) 200 MG TABLET    Take 400 mg by mouth every 6 (six) hours as needed for moderate pain.   OXYCODONE-ACETAMINOPHEN (PERCOCET) 7.5-325 MG PER TABLET       TRAZODONE (DESYREL) 50 MG TABLET      Modified Medications   No medications on file  Discontinued Medications   HYDROCODONE-ACETAMINOPHEN (NORCO/VICODIN) 5-325 MG PER TABLET    Take 1 tablet by mouth at bedtime as needed for moderate pain.   MELOXICAM (MOBIC) 7.5 MG TABLET    Take 1 tablet (7.5 mg total) by mouth daily.   ONDANSETRON (ZOFRAN) 4 MG TABLET    Take 1 tablet (4 mg total) by mouth every 8 (eight) hours as needed for nausea or vomiting.   TIZANIDINE (ZANAFLEX) 4 MG CAPSULE    Take 1 capsule (4 mg total) by mouth at bedtime as needed for muscle spasms.

## 2014-11-08 NOTE — Progress Notes (Signed)
Pre visit review using our clinic review tool, if applicable. No additional management support is needed unless otherwise documented below in the visit note. 

## 2014-11-08 NOTE — Patient Instructions (Signed)
Take 30 minutes before bed.   After 3-4 weeks, try to take 2 tablets before bed.

## 2014-11-09 ENCOUNTER — Telehealth: Payer: Self-pay | Admitting: Internal Medicine

## 2014-11-09 NOTE — Telephone Encounter (Signed)
emmi emailed °

## 2015-01-09 ENCOUNTER — Ambulatory Visit: Payer: BC Managed Care – PPO | Admitting: Internal Medicine

## 2015-06-04 IMAGING — CR DG CHEST 2V
2 series · 2 of 2 positions shown · non-contrast
Comparison: 11/23/2012

CLINICAL DATA: Short of breath.  Cough

EXAM:
CHEST  2 VIEW

[w chest pa]
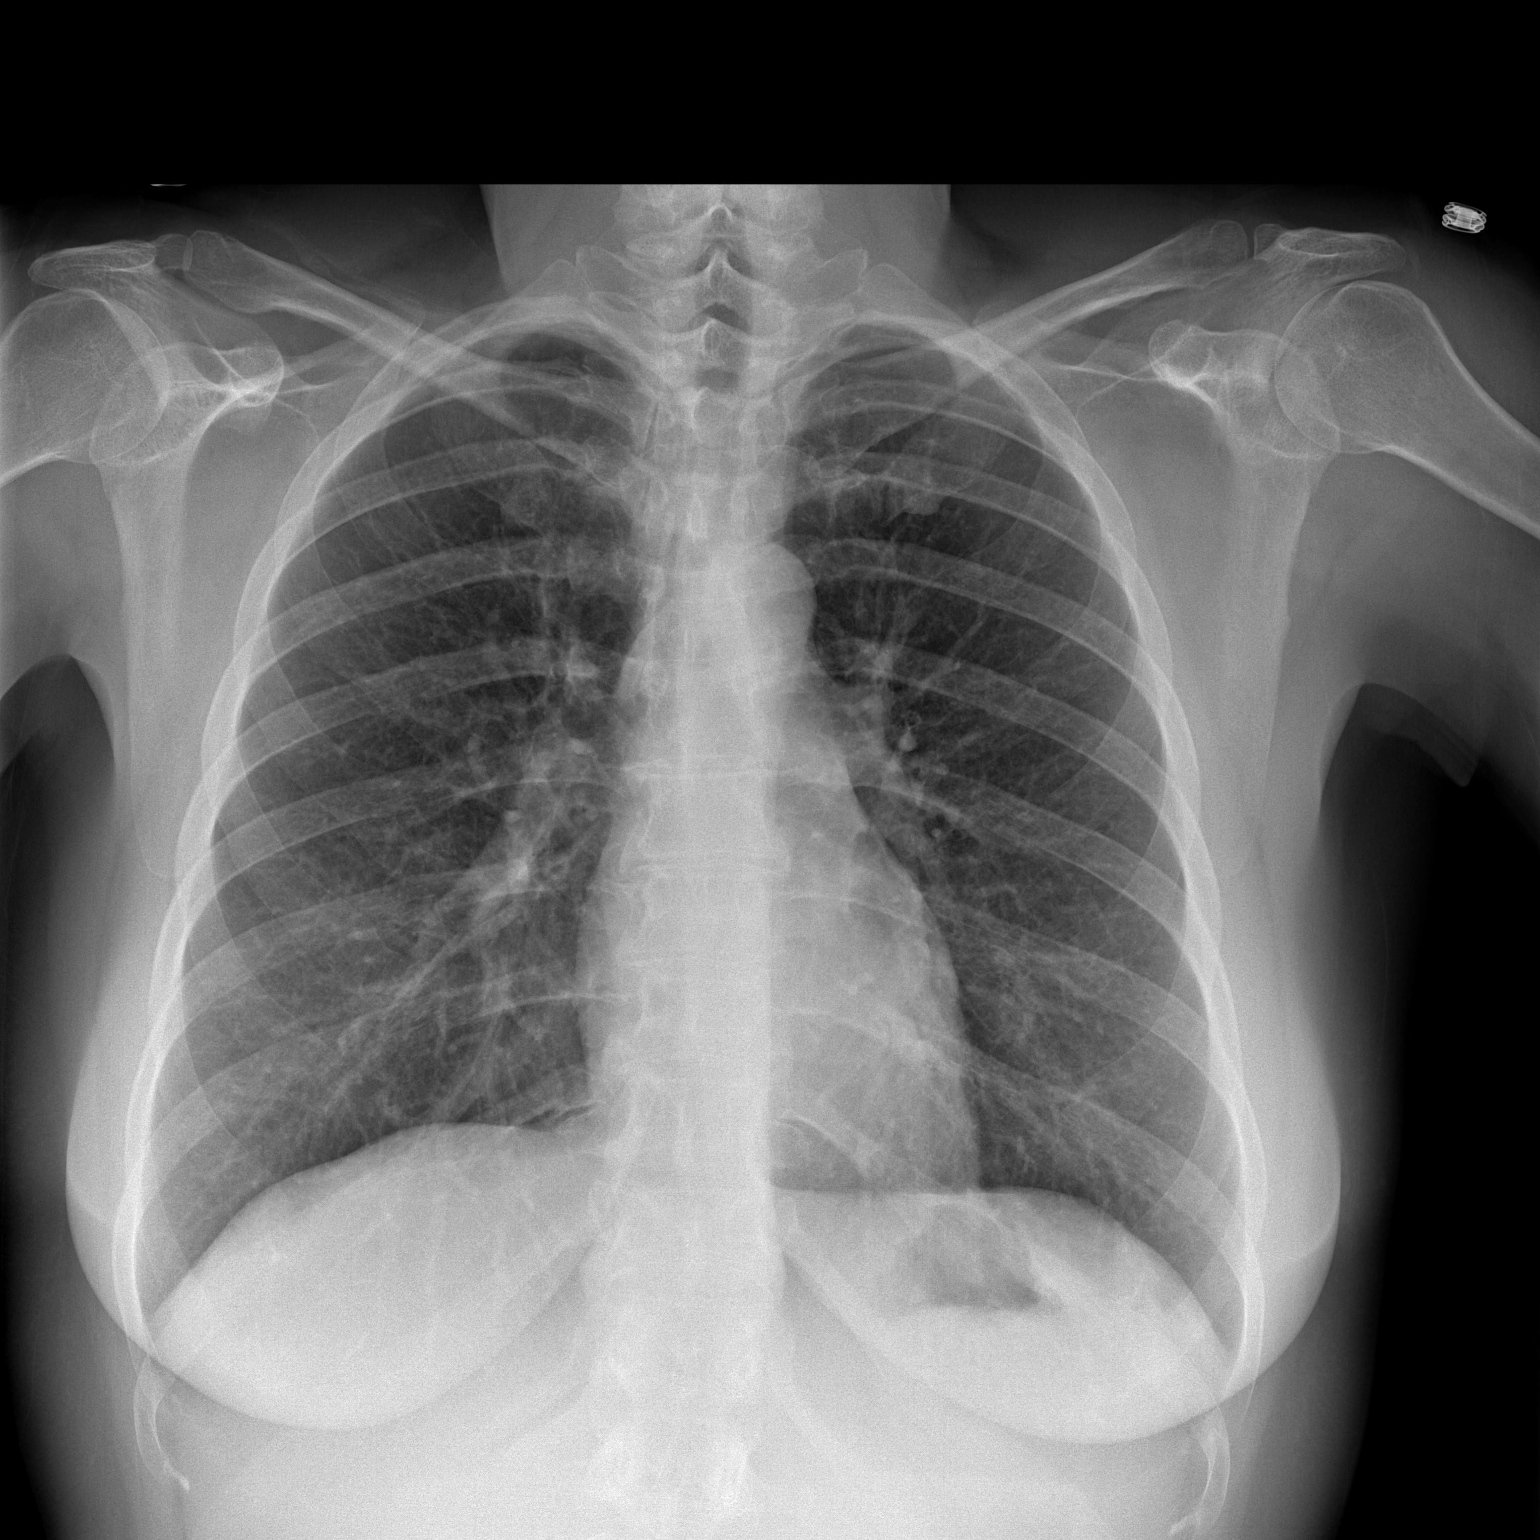

[w chest lat]
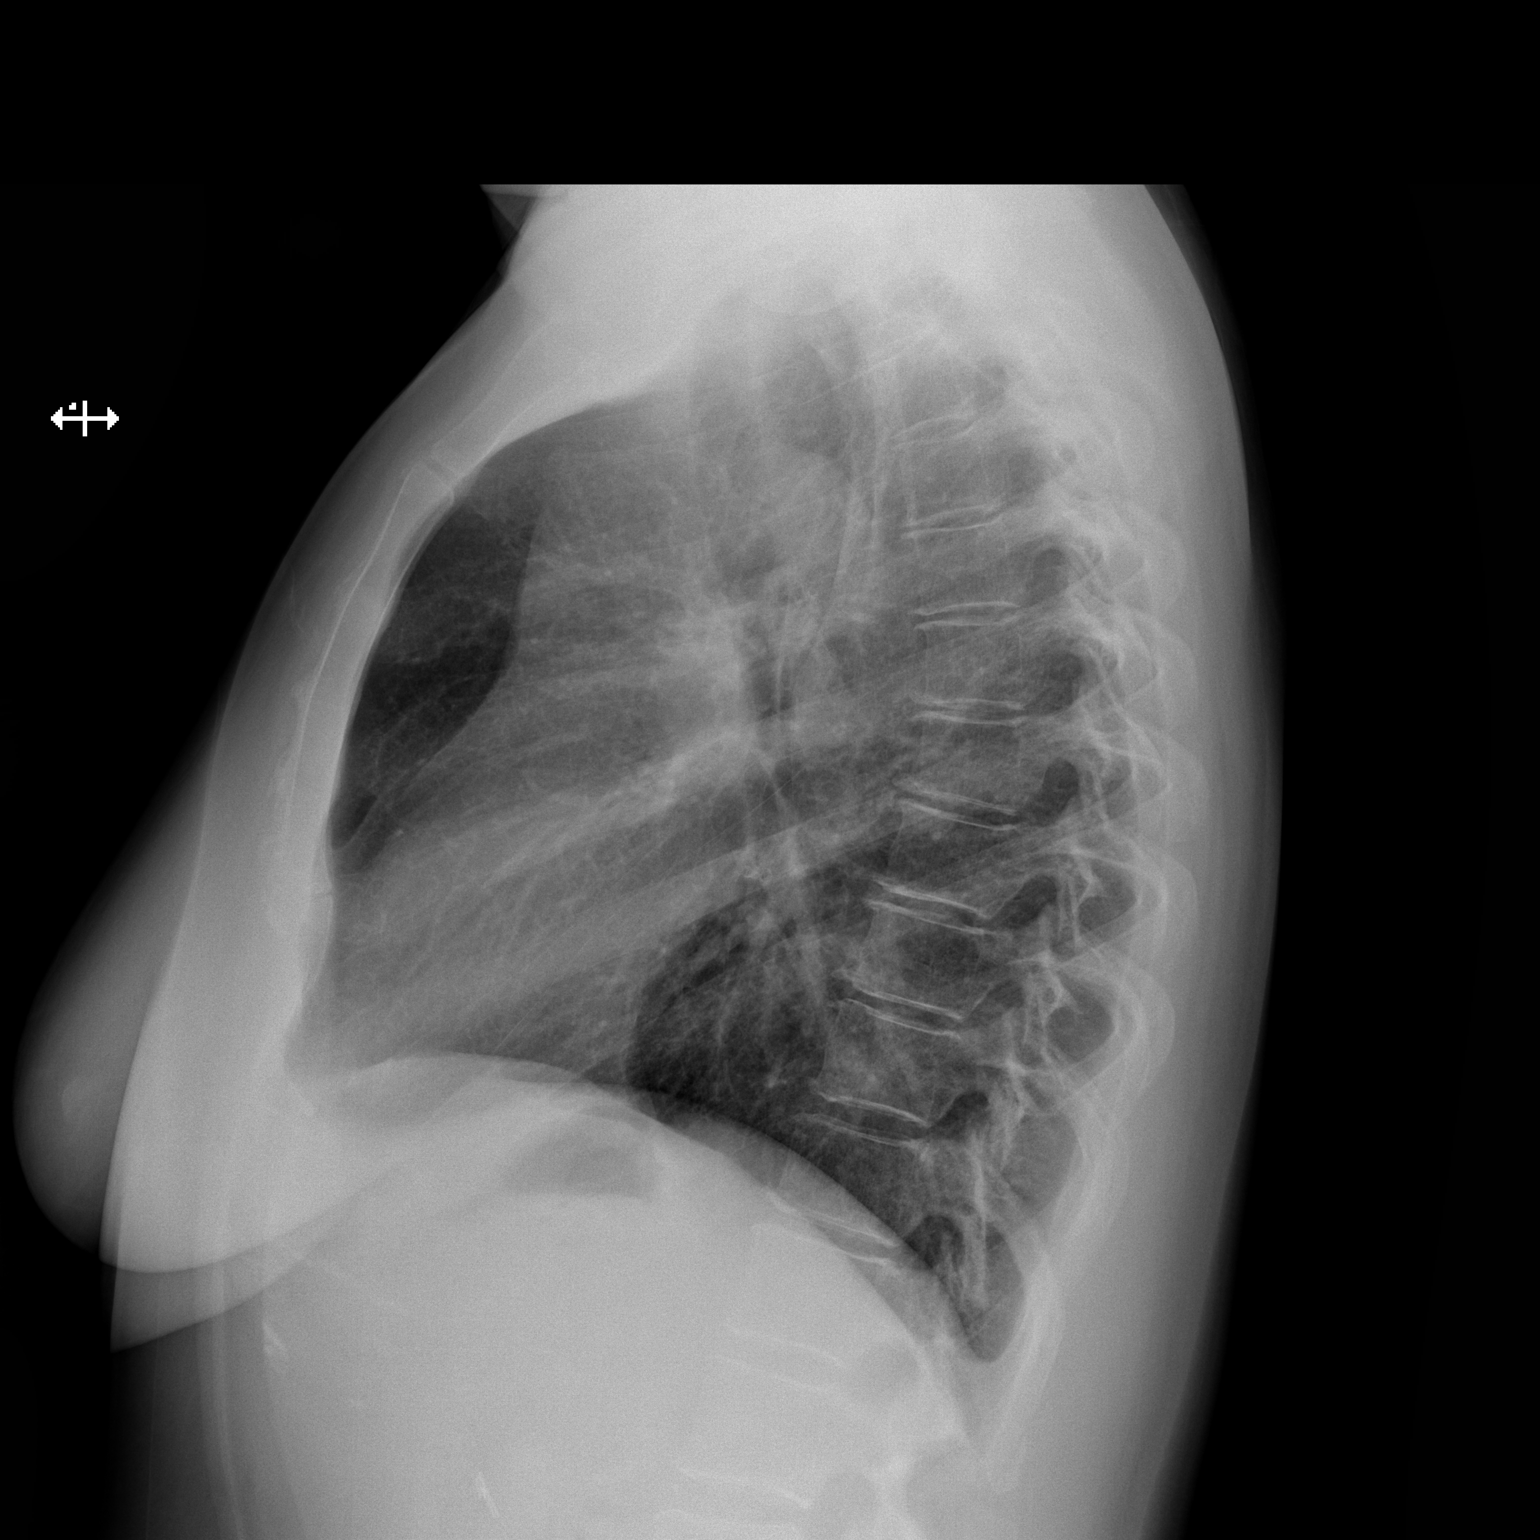

[2 of 2 positions shown; findings below may reference images not displayed]

FINDINGS: Normal heart size. Clear lungs. Bronchitic changes. Mild
hyperaeration.
IMPRESSION: No active cardiopulmonary disease.

## 2016-01-31 IMAGING — MR MR LUMBAR SPINE W/O CM
4 of 5 series · 19 of 48 positions shown · non-contrast
Comparison: None.

CLINICAL DATA: Low back pain radiating down the RIGHT hip band to
the RIGHT leg. Pain and numbness for 4 years. Chronic
injury.Bilateral low back pain without sciatica PHP.H (O4D-CS-CM)
initial encounter.

EXAM:
MRI LUMBAR SPINE WITHOUT CONTRAST
TECHNIQUE: Multiplanar, multisequence MR imaging of the lumbar spine was
performed. No intravenous contrast was administered.

[Series 2: T1 · sagittal · 4.0mm · 0.51mm/px · 3 of 12 slices shown (1 of 2)]
[im 1/12]
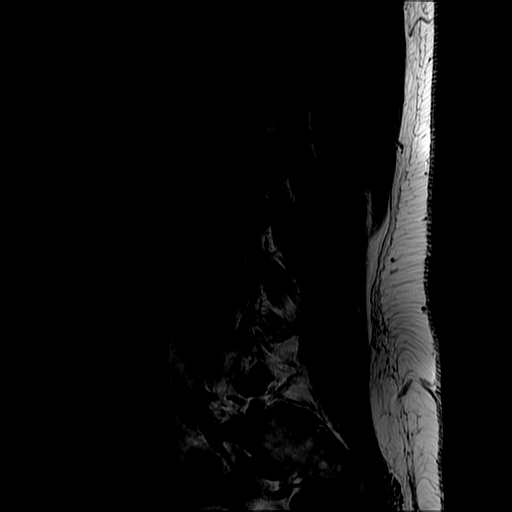
[im 8/12]
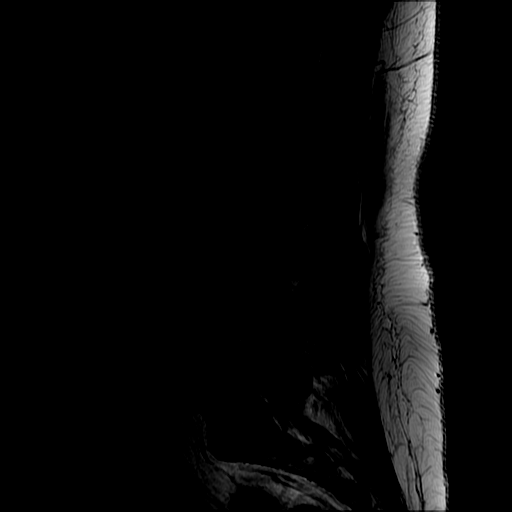
[im 12/12]
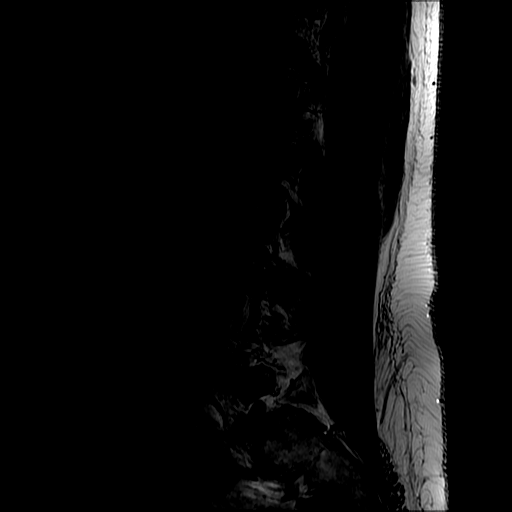

[Series 3: T2 · sagittal · 4.0mm · 0.51mm/px · 5 of 12 slices shown (1 of 2)]
[im 1/12]
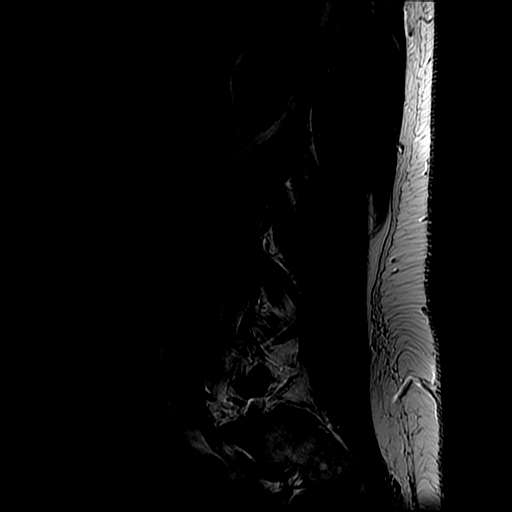
[im 3/12]
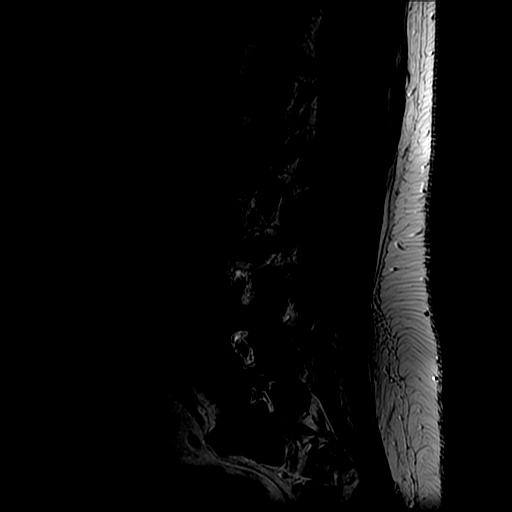
[im 6/12]
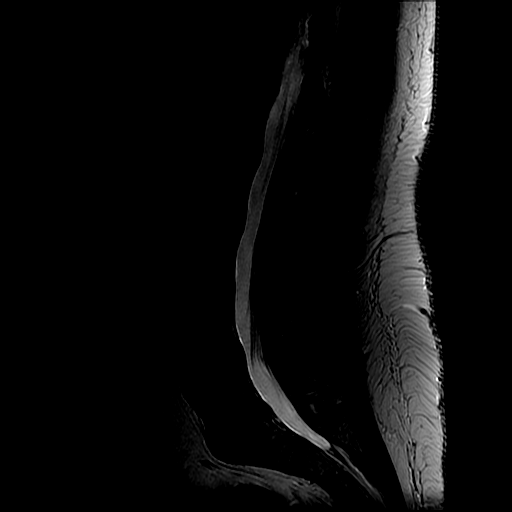
[im 9/12]
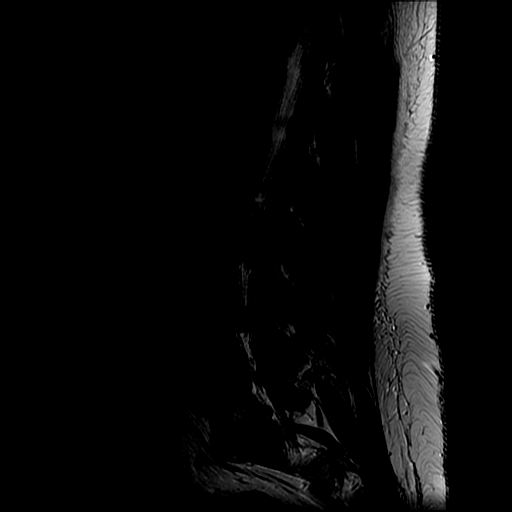
[im 12/12]
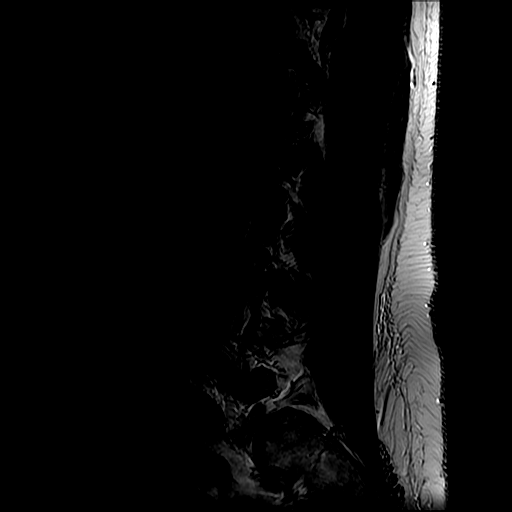

[Series 5: T2 · axial · 4.0mm · 0.39mm/px · z∈[-136,+56]mm · 8 of 40 slices shown (2 of 2)]
[im 3/40]
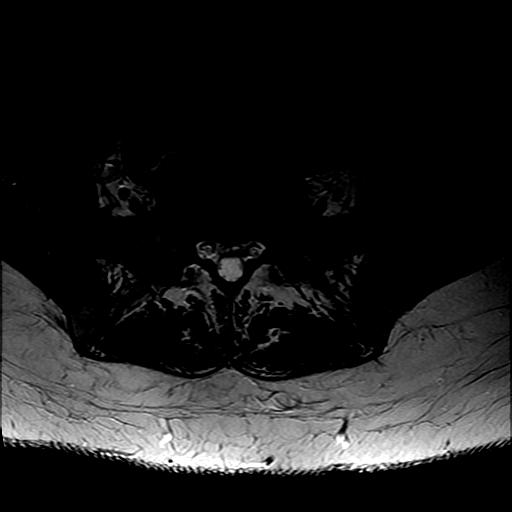
[im 5/40]
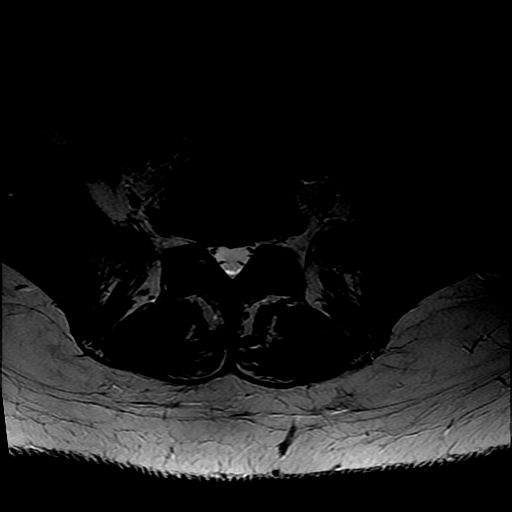
[im 8/40]
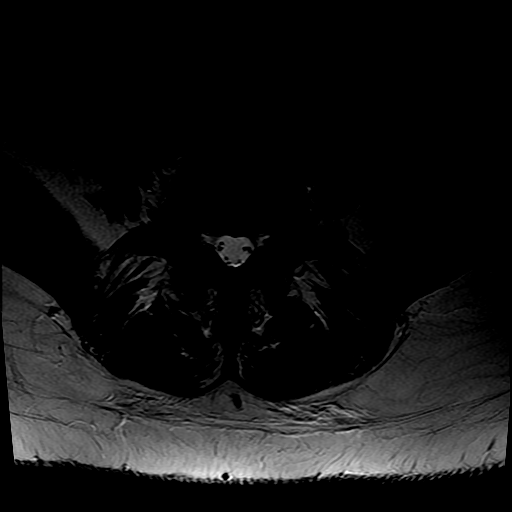
[im 13/40]
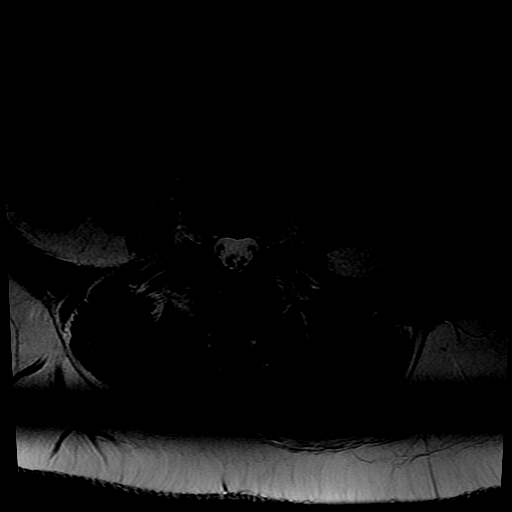
[im 18/40]
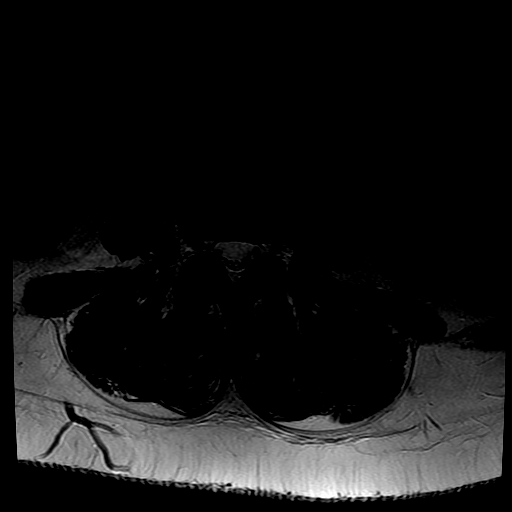
[im 20/40]
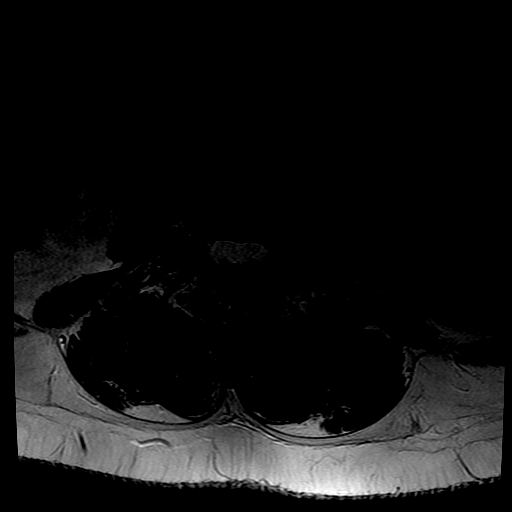
[im 22/40]
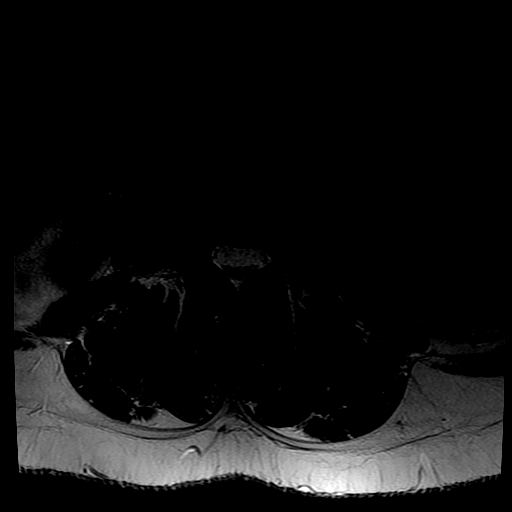
[im 35/40]
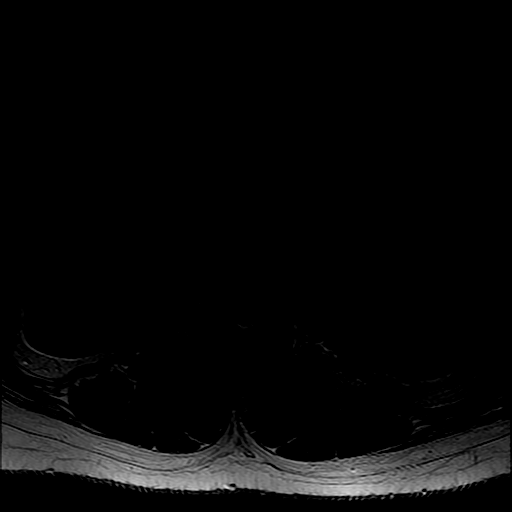

[Series 6: T1 · axial · 4.0mm · 0.39mm/px · z∈[-127,+56]mm · 3 of 40 slices shown (2 of 2)]
[im 5/40]
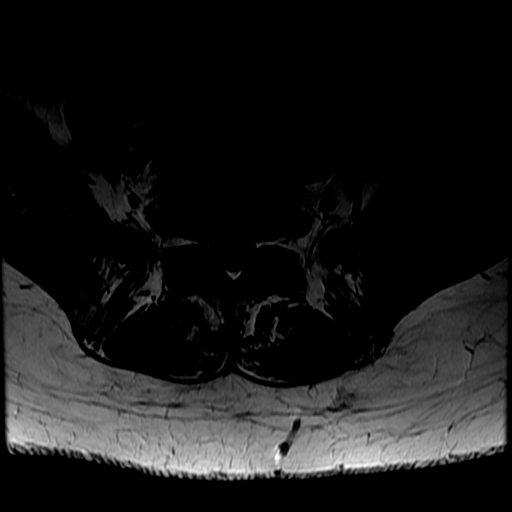
[im 20/40]
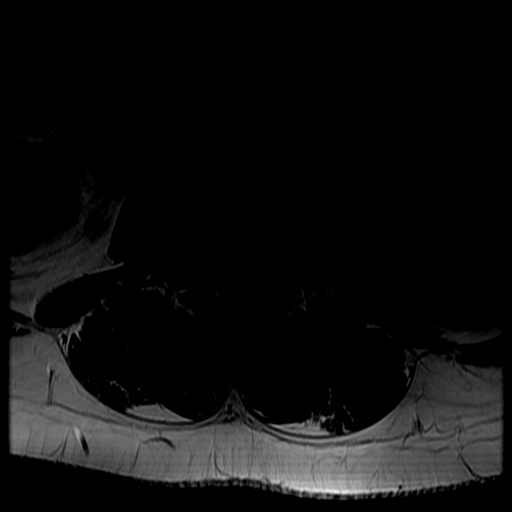
[im 35/40]
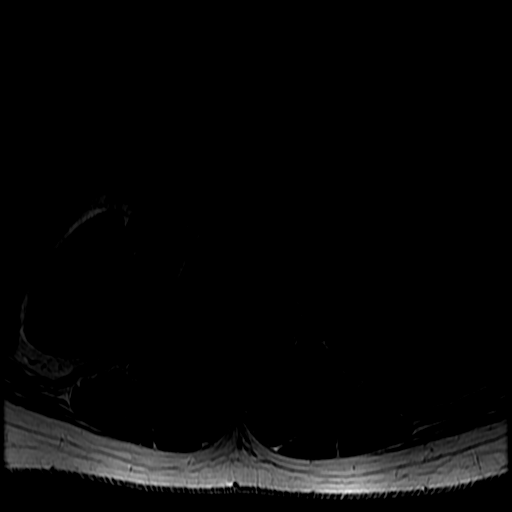

[19 of 48 positions shown; findings below may reference images not displayed]

FINDINGS: Segmentation: The numbering convention used for this exam termed
L5-S1 as the last intervertebral disc space.

Alignment: Grade I retrolisthesis of L5 on S1 measuring 4 mm. This
is secondary to disc space collapse. There is a mild levoconvex
curve with the apex at L3.

Vertebrae: No fracture. Mild degenerative endplate changes at L5-S1.
Benign hemangioma present at L3.

Conus medullaris: Normal termination at L1- L2.

Paraspinal tissues: Normal.

Disc levels:

Intervertebral discs from T12-L1 through L3-L4 are normal.

L4-L5: Normal disc. Bilateral facet arthrosis and posterior
ligamentum flavum redundancy. Minimal central stenosis. Both neural
foramina appear adequately patent.

L5-S1: Severe disc degeneration with loss of height. Shallow
broad-based posterior bulging and endplate osteophytes. Central
canal and lateral recesses remain patent. Mild symmetric bilateral
foraminal narrowing secondary to endplate osteophytes and collapse
of the disc.
IMPRESSION: Mild lumbar spondylosis and facet arthrosis. Degenerative disease
most pronounced at L5-S1 with collapse of the L5-S1 disc and
endplate osteophytes. Mild symmetric bilateral foraminal narrowing
associated with endplate osteophytes and loss of disc height.
